# Patient Record
Sex: Male | Born: 1995 | Race: White | Hispanic: No | Marital: Married | Smoking: Current every day smoker
Health system: Southern US, Community
[De-identification: ages and names within clinical notes are randomized; demographics above are authoritative.]

## PROBLEM LIST (undated history)

## (undated) DIAGNOSIS — E669 Obesity, unspecified: Secondary | ICD-10-CM

## (undated) DIAGNOSIS — T7840XA Allergy, unspecified, initial encounter: Secondary | ICD-10-CM

## (undated) HISTORY — DX: Allergy, unspecified, initial encounter: T78.40XA

## (undated) HISTORY — DX: Obesity, unspecified: E66.9

---
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
## 2016-05-02 ENCOUNTER — Ambulatory Visit (HOSPITAL_COMMUNITY)
Admission: EM | Admit: 2016-05-02 | Discharge: 2016-05-02 | Disposition: A | Discharge status: Home / Self Care | Payer: Medicaid Other | Attending: Family Medicine | Admitting: Family Medicine

## 2016-05-02 ENCOUNTER — Encounter (HOSPITAL_COMMUNITY): Payer: Self-pay | Admitting: Emergency Medicine

## 2016-05-02 DIAGNOSIS — J039 Acute tonsillitis, unspecified: Secondary | ICD-10-CM

## 2016-05-02 MED ORDER — AMOXICILLIN 875 MG PO TABS: 875.0000 mg | ORAL_TABLET | Freq: Two times a day (BID) | ORAL | 0 refills | Status: DC

## 2016-05-02 MED ORDER — OSELTAMIVIR PHOSPHATE 75 MG PO CAPS: 75.0000 mg | ORAL_CAPSULE | Freq: Every day | ORAL | 0 refills | Status: DC

## 2016-05-02 NOTE — ED Triage Notes (Signed)
Pt c/o cold sx onset: yest  Sx include: nasal drainage/congestion, ST, sneezing, chest pain  Denies: fevers  A&O x4... NAD

## 2016-05-02 NOTE — ED Provider Notes (Signed)
[place]    CSN: [id number] Arrival date & time: 05/02/16  [place]     History   Chief Complaint Chief Complaint  Patient presents with  . URI    HPI Antonio Ellis is a 21 y.o. male.   This a 21 year old lad who comes in to the [facility] urgent care center with complaints consistent with an upper respiratory infection. His symptoms began yesterday with sore throat. He has not had any fever, runny nose, no myalgia.  Patient works in a Adult nurse. 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      Past Medical History:  Diagnosis Date  . Allergy    RHINITIS  . Obesity     Patient Active Problem List   Diagnosis Date Noted  . Need for hepatitis A vaccination 04/19/2011  . Need for HPV vaccination 04/19/2011  . Obesity 10/11/2010    Past Surgical History:  Procedure Laterality Date  . NO PAST SURGERIES  09/2014       Home Medications    Prior to Admission medications   Medication Sig Start Date End Date Taking? Authorizing Provider  amoxicillin (AMOXIL) 875 MG tablet Take 1 tablet (875 mg total) by mouth 2 (two) times daily. 05/02/16   Robyn Haber, MD  aspirin-acetaminophen-caffeine (EXCEDRIN MIGRAINE) [phone] MG per tablet Take 2 tablets by mouth every 6 (six) hours as needed for headache.    Historical Provider, MD  HYDROcodone-acetaminophen (NORCO/VICODIN) 5-325 MG tablet Take 2 tablets by mouth every 4 (four) hours as needed. 01/31/15   Fransico Meadow, PA-C  ibuprofen (ADVIL,MOTRIN) 200 MG tablet Take 200 mg by mouth every 6 (six) hours as needed.    Historical Provider, MD  oseltamivir (TAMIFLU) 75 MG capsule Take 1 capsule (75 mg total) by mouth daily. 05/02/16   Robyn Haber, MD    Family History Family History  Problem Relation Age of Onset  . Hypertension Father   . Heart disease Father 37    MI  . Hypertension Paternal Uncle   . Seizures Paternal Grandmother   . Hypertension Paternal Grandmother   . Heart disease Paternal  Grandmother   . Seizures Paternal Grandfather   . Hypertension Maternal Grandmother   . Hyperlipidemia Maternal Grandmother   . Diabetes Maternal Grandmother   . Cancer Maternal Grandfather     bladder  . Hypertension Maternal Grandfather   . Hyperlipidemia Maternal Grandfather     Social History Social History  Substance Use Topics  . Smoking status: Never Smoker  . Smokeless tobacco: Never Used  . Alcohol use No     Allergies   Patient has no known allergies.   Review of Systems Review of Systems  Constitutional: Negative.   HENT: Positive for sore throat.   Respiratory: Positive for cough.   Gastrointestinal: Negative.   Genitourinary: Negative.   Musculoskeletal: Negative.   Neurological: Negative.      Physical Exam Triage Vital Signs ED Triage Vitals [05/02/16 1901]  Enc Vitals Group     BP 118/72     Pulse Rate 64     Resp 20     Temp 98.2 F (36.8 C)     Temp Source Oral     SpO2 100 %     Weight      Height      Head Circumference      Peak Flow      Pain Score      Pain Loc  Pain Edu?      Excl. in [place]?    No data found.   Updated Vital Signs BP 118/72 (BP Location: Left Arm)   Pulse 64   Temp 98.2 F (36.8 C) (Oral)   Resp 20   SpO2 100%    Physical Exam  Constitutional: He is oriented to person, place, and time. He appears well-developed and well-nourished.  HENT:  Head: Normocephalic.  Right Ear: External ear normal.  Left Ear: External ear normal.  Mouth/Throat: Oropharyngeal exudate present.  Eyes: Conjunctivae and EOM are normal. Pupils are equal, round, and reactive to light.  Neck: Normal range of motion. No thyromegaly present.  Cardiovascular: Normal rate, regular rhythm and normal heart sounds.   Pulmonary/Chest: Effort normal and breath sounds normal.  Musculoskeletal: Normal range of motion.  Lymphadenopathy:    He has no cervical adenopathy.  Neurological: He is alert and oriented to person, place, and time.    Skin: Skin is warm and dry.  Nursing note and vitals reviewed.    UC Treatments / Results  Labs (all labs ordered are listed, but only abnormal results are displayed) Labs Reviewed - No data to display  EKG  EKG Interpretation None       Radiology No results found.  Procedures Procedures (including critical care time)  Medications Ordered in UC Medications - No data to display   Initial Impression / Assessment and Plan / UC Course  I have reviewed the triage vital signs and the nursing notes.  Pertinent labs & imaging results that were available during my care of the patient were reviewed by me and considered in my medical decision making (see chart for details).     Final Clinical Impressions(s) / UC Diagnoses   Final diagnoses:  Tonsillitis    New Prescriptions New Prescriptions   AMOXICILLIN (AMOXIL) 875 MG TABLET    Take 1 tablet (875 mg total) by mouth 2 (two) times daily.   OSELTAMIVIR (TAMIFLU) 75 MG CAPSULE    Take 1 capsule (75 mg total) by mouth daily.     Robyn Haber, MD 05/02/16 [phone]

## 2016-12-05 ENCOUNTER — Encounter: Payer: Self-pay | Admitting: Medical

## 2016-12-05 ENCOUNTER — Ambulatory Visit (INDEPENDENT_AMBULATORY_CARE_PROVIDER_SITE_OTHER): Payer: BLUE CROSS/BLUE SHIELD | Admitting: Medical

## 2016-12-05 ENCOUNTER — Ambulatory Visit: Payer: BLUE CROSS/BLUE SHIELD | Admitting: Medical

## 2016-12-05 VITALS — BP 124/80 | HR 66 | Wt 283.4 lb

## 2016-12-05 DIAGNOSIS — S99922A Unspecified injury of left foot, initial encounter: Secondary | ICD-10-CM | POA: Diagnosis not present

## 2016-12-05 DIAGNOSIS — M79672 Pain in left foot: Secondary | ICD-10-CM

## 2016-12-05 NOTE — Progress Notes (Signed)
Subjective: Chief Complaint  Patient presents with  . Foot Pain    lt foot pain , injury it yesterday    Here with wife and his 20 yo son.  He was chasing his uncle's dog that got out of the house yesterday to get him back in the house.   He was running and turned, felt a pop in his left foot, had immediate pain.  Since then has pain with weight bearing, can't bear full weight.    Iced some last night.  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  No other aggravating or relieving factors. No other complaint.  Past Medical History:  Diagnosis Date  . Allergy    RHINITIS  . Obesity    Current Outpatient Prescriptions on File Prior to Visit  Medication Sig Dispense Refill  . ibuprofen (ADVIL,MOTRIN) 200 MG tablet Take 200 mg by mouth every 6 (six) hours as needed.    Marland Kitchen aspirin-acetaminophen-caffeine (EXCEDRIN MIGRAINE) [phone] MG per tablet Take 2 tablets by mouth every 6 (six) hours as needed for headache.     No current facility-administered medications on file prior to visit.    ROS as in subjective   Objective: BP 124/80   Pulse 66   Wt 283 lb 6.4 oz (128.5 kg)   SpO2 98%    Gen: wd, wn, nad Skin: no bruising, warm Left foot tender over 5th proximal toe, left 5th metatarsal throughout, worse distal portion of the metatarsal.   Pain with weight bearing, pain with 4th and 5th toe ROM.   Mild swelling in general area of 5th distal metatarsal.  Ankle and rest of feet non tender without deformity, normal ROM of ankle and rest of toes, Toes and feet otherwise neurovascularly intact   Assessment: Encounter Diagnoses  Name Primary?  Marland Kitchen Foot injury, left, initial encounter Yes  . Left foot pain      Plan: Discussed possibility of fracture vs contusion and sprain.   Advised rest, non weight bearing left foot, elevation of foot, ice 20 min TID with cool water bath, Ibuprofen OTC 200mg , 3 tablets TID, go for xray.   F/u pending xray.  Nova was seen today for foot pain.  Diagnoses and  all orders for this visit:  Foot injury, left, initial encounter -     DG Foot Complete Left; Future  Left foot pain -     DG Foot Complete Left; Future

## 2017-02-11 ENCOUNTER — Encounter: Payer: Self-pay | Admitting: Family Medicine

## 2017-02-11 ENCOUNTER — Ambulatory Visit: Payer: BLUE CROSS/BLUE SHIELD | Admitting: Family Medicine

## 2017-02-11 VITALS — BP 120/80 | HR 92 | Temp 98.4°F | Resp 16 | Wt 238.6 lb

## 2017-02-11 DIAGNOSIS — J029 Acute pharyngitis, unspecified: Secondary | ICD-10-CM

## 2017-02-11 LAB — POC INFLUENZA A&B (BINAX/QUICKVUE)
Influenza A, POC: NEGATIVE
Influenza B, POC: NEGATIVE

## 2017-02-11 LAB — POCT RAPID STREP A (OFFICE): Rapid Strep A Screen: NEGATIVE

## 2017-02-11 NOTE — Progress Notes (Signed)
Chief Complaint  Patient presents with  . possible strep    possible strep. sore throat since yesterday    Subjective:  Antonio Ellis is a 21 y.o. male who presents for evaluation of sore throat.  He has had a recent close exposure to someone with proven streptococcal pharyngitis.  Associated symptoms include mild cough yesterday but none today.  States he has a 20 year old at home and his wife insists that he be tested for strep and flu.  Denies fever, chills, body aches, fatigue, headache, ear pain, rhinorrhea, nasal congestion, chest pain, palpitations, shortness of breath, wheezing, abdominal pain, N/V/D.   Treatment to date: cough suppressants and decongestants.  positive sick contacts.  No other aggravating or relieving factors.  No other c/o.  The following portions of the patient's history were reviewed and updated as appropriate: allergies, current medications, past medical history, past social history, past surgical history and problem list.  ROS as in subjective   Objective: Vitals:   02/11/17 0943  BP: 120/80  Pulse: 92  Resp: 16  Temp: 98.4 F (36.9 C)  SpO2: 98%    General appearance: no distress, WD/WN, is not ill-appearing HEENT: normocephalic, conjunctiva/corneas normal, sclerae anicteric, nares patent, no discharge or erythema, pharynx with erythema, no edema or exudate.  Oral cavity: MMM, no lesions  Neck: supple, no lymphadenopathy, no thyromegaly Heart: RRR, normal S1, S2, no murmurs Lungs: CTA bilaterally, no wheezes, rhonchi, or rales   Laboratory Strep test done. Results:negative.    Assessment and Plan: Acute pharyngitis, unspecified etiology - Plan: POC Influenza A&B(BINAX/QUICKVUE)  Sore throat - Plan: POCT rapid strep A   Advised that symptoms and exam suggest a viral etiology. Rapid strep is negative. He would like to be tested for flu. discussed that his symptoms are not flu-like and he understand this but would still like to be tested.   Flu test done and negative.   Discussed symptomatic treatment including salt water gargles, warm fluids, rest, hydrate well, can use over-the-counter Tylenol or ibuprofen for throat pain, fever, or malaise. If worse or not improving within 2-3 days, call or return.

## 2017-02-11 NOTE — Patient Instructions (Signed)
Your strep test is negative.   I suspect your symptoms are related to a viral illness and recommend treating your symptoms at this point.  Mucinex for congestion and cough, drink extra water, use salt water gargles for throat irritation and Tylenol or Ibuprofen for aches and pains.  Call if you are not improving by days 7-10 of your illness or if you develop any worsening symptoms such as high fever, shortness of breath or wheezing.

## 2019-03-03 ENCOUNTER — Other Ambulatory Visit: Payer: Self-pay

## 2019-03-03 DIAGNOSIS — Z20822 Contact with and (suspected) exposure to covid-19: Secondary | ICD-10-CM

## 2019-03-05 LAB — NOVEL CORONAVIRUS, NAA: SARS-CoV-2, NAA: NOT DETECTED

## 2019-03-12 ENCOUNTER — Other Ambulatory Visit: Payer: Self-pay

## 2019-03-12 ENCOUNTER — Ambulatory Visit: Payer: BC Managed Care – PPO | Admitting: Medical

## 2019-03-12 VITALS — BP 120/70 | HR 73 | Temp 97.8°F | Wt 272.0 lb

## 2019-03-12 DIAGNOSIS — L089 Local infection of the skin and subcutaneous tissue, unspecified: Secondary | ICD-10-CM

## 2019-03-12 DIAGNOSIS — L723 Sebaceous cyst: Secondary | ICD-10-CM | POA: Diagnosis not present

## 2019-03-12 MED ORDER — CEPHALEXIN 500 MG PO CAPS: 500.0000 mg | ORAL_CAPSULE | Freq: Three times a day (TID) | ORAL | 0 refills | Status: DC

## 2019-03-12 MED FILL — CEPHALEXIN 500 MG CAPSULE: 500 | 10 days supply | Qty: 30 | Fill #0

## 2019-03-12 NOTE — Progress Notes (Signed)
  Subjective:   Antonio Ellis is a 23 y.o. male who presents for evaluation of a probable cutaneous abscess. Lesion is located in the center of the chest.  He notes having some type of subcutaneous mass there months ago that has come and gone before but never swollen or red.  No prior drainage.  2 days ago a piece of wood bumped into his chest and then within the next few hours he started having redness and then later that day some pus drainage.  That has gotten gradually worse till now.  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  He denies concerns for splinter or foreign body.  No prior problems with incision and drainage of other abscesses. No other aggravating or relieving factors.  No other c/o.  Past Medical History:  Diagnosis Date  . Allergy    RHINITIS  . Obesity     Reviewed prior allergies, medications, past medical history, past surgical history.  ROS as in subjective   Objective:   Vitals:   03/12/19 1203  BP: 120/70  Pulse: 73  Temp: 97.8 F (36.6 C)    Gen: wd, wn, nad In the center of his chest between the breasts over the sternum is a 2.5 cm diameter raised subcutaneous mass consistent with an infected sebaceous cyst.  There is erythema at the punctate entry point with some slight purulent drainage but no generalized erythema or induration No other skin lesion of concern   Assessment:   Encounter Diagnosis  Name Primary?  . Infected sebaceous cyst Yes      Plan:   Discussed examination findings, diagnosis, usual course of illness, and options for therapy discussed. After discussing recommendations, patient agrees to I&D, oral antibiotics.    Procedure Informed consent obtained.  The area was prepped in the usual manner and the skin overlying the opening of the infected cyst was anesthetized with 2cc of 1% lidocaine with epinephrine.  The area was incised and probed, approx 4ccs of purulent and bloody material was obtained.  Area was irrigated with  high pressure saline. Packing was not inserted. Wound was covered with sterile bandage.    Advised patient to complete the course of oral antibiotics, use warm compresses or heat applied to the area to promote drainage.  Follow up: As needed.  However, if worse signs of infections as discussed (fever, chills, nausea, vomiting, worsening redness, worsening pain), then call or return immediately.

## 2019-03-12 NOTE — Patient Instructions (Signed)
Recommendations:  Begin Keflex antibiotic 3 times daily for 10 days  Use warm compresses such as moist hot towel for 20 minutes a few times daily or hot shower for cleaning and heat therapy to the affected area the next 3-4 days  Keep the area covered with bandaid or gauze as it will likely drain some more pus the next few days  You can use over the counter Ibuprofen 200mg , 3 tablets 2 or 3 times daily the next week for redness, inflammation and pain if needed  If not gradually improving within 10 days, then call or return  If the area seems to fill back up like a cyst again over the next few weeks let me know and we can refer dermatology   Epidermal Cyst  An epidermal cyst is a small, painless lump under your skin. The cyst contains a grayish-white, bad-smelling substance (keratin). Do not try to pop or open an epidermal cyst yourself. What are the causes?  A blocked hair follicle.  A hair that curls and re-enters the skin instead of growing straight out of the skin.  A blocked pore.  Irritated skin.  An injury to the skin.  Certain conditions that are passed along from parent to child (inherited).  Human papillomavirus (HPV).  Long-term sun damage to the skin. What increases the risk?  Having acne.  Being overweight.  Being 37-58 years old. What are the signs or symptoms? These cysts are usually harmless, but they can get infected. Symptoms of infection may include:  Redness.  Inflammation.  Tenderness.  Warmth.  Fever.  A grayish-white, bad-smelling substance drains from the cyst.  Pus drains from the cyst. How is this treated? In many cases, epidermal cysts go away on their own without treatment. If a cyst becomes infected, treatment may include:  Opening and draining the cyst, done by a doctor. After draining, you may need minor surgery to remove the rest of the cyst.  Antibiotic medicine.  Shots of medicines (steroids) that help to reduce  inflammation.  Surgery to remove the cyst. Surgery may be done if the cyst: ? Becomes large. ? Bothers you. ? Has a chance of turning into cancer.  Do not try to open a cyst yourself. Follow these instructions at home:  Take over-the-counter and prescription medicines only as told by your doctor.  If you were prescribed an antibiotic medicine, take it it as told by your doctor. Do not stop using the antibiotic even if you start to feel better.  Keep the area around your cyst clean and dry.  Wear loose, dry clothing.  Avoid touching your cyst.  Check your cyst every day for signs of infection. Check for: ? Redness, swelling, or pain. ? Fluid or blood. ? Warmth. ? Pus or a bad smell.  Keep all follow-up visits as told by your doctor. This is important. How is this prevented?  Wear clean, dry, clothing.  Avoid wearing tight clothing.  Keep your skin clean and dry. Take showers or baths every day. Contact a doctor if:  Your cyst has symptoms of infection.  Your condition does not improve or gets worse.  You have a cyst that looks different from other cysts you have had.  You have a fever. Get help right away if:  Redness spreads from the cyst into the area close by. Summary  An epidermal cyst is a sac made of skin tissue.  If a cyst becomes infected, treatment may include surgery to open and drain  the cyst, or to remove it.  Take over-the-counter and prescription medicines only as told by your doctor.  Contact a doctor if your condition is not improving or is getting worse.  Keep all follow-up visits as told by your doctor. This is important. This information is not intended to replace advice given to you by your health care provider. Make sure you discuss any questions you have with your health care provider. Document Released: 04/25/2004 Document Revised: 07/09/2018 Document Reviewed: 12/25/2017 Elsevier Patient Education  2020 [employer].

## 2019-04-12 ENCOUNTER — Other Ambulatory Visit: Payer: Self-pay

## 2019-04-12 ENCOUNTER — Emergency Department
Admission: EM | Admit: 2019-04-12 | Discharge: 2019-04-12 | Disposition: A | Discharge status: Home / Self Care | Payer: Self-pay | Source: Home / Self Care

## 2019-04-12 DIAGNOSIS — Z20822 Contact with and (suspected) exposure to covid-19: Secondary | ICD-10-CM

## 2019-04-12 DIAGNOSIS — B349 Viral infection, unspecified: Secondary | ICD-10-CM | POA: Diagnosis not present

## 2019-04-12 LAB — POC INFLUENZA A AND B ANTIGEN (URGENT CARE ONLY)
Influenza A Ag: NEGATIVE
Influenza B Ag: NEGATIVE

## 2019-04-12 LAB — POC SARS CORONAVIRUS 2 AG -  ED: SARS Coronavirus 2 Ag: NEGATIVE

## 2019-04-12 NOTE — ED Triage Notes (Signed)
Pt presents to clinic c/o body aches, fever and headaches. To the best of his knowledge no known exposure. Pt has taken Nyquil for tmax 102 F. No other complaints.  Pt has been self quarantined since onset of symptoms 04/10/2019.

## 2019-04-12 NOTE — ED Provider Notes (Signed)
[facility]    CSN: [id number] Arrival date & time: 04/12/19  0956      History   Chief Complaint Chief Complaint  Patient presents with  . Fever  . Generalized Body Aches    HPI Antonio Ellis is a 24 y.o. male.   HPI  Antonio Ellis presents for COVID-19 testing.   Onset of symptoms 04/10/2019.Symptoms include body aches, chills, fever, headache, fever. TMAX 102 F, currently has low grade fever 99.17F. No known exposures to individual positive for COVID-19. No recent COVID-19 test within 14 days. He has taken Nyquil for management of symptoms without significant relief. Denies shortness of breath or chest tightness. Past Medical History:  Diagnosis Date  . Allergy    RHINITIS  . Obesity     Patient Active Problem List   Diagnosis Date Noted  . Need for hepatitis A vaccination 04/19/2011  . Need for HPV vaccination 04/19/2011  . Obesity 10/11/2010    Past Surgical History:  Procedure Laterality Date  . NO PAST SURGERIES  09/2014    Home Medications    Prior to Admission medications   Medication Sig Start Date End Date Taking? Authorizing Provider  cephALEXin (KEFLEX) 500 MG capsule Take 1 capsule (500 mg total) by mouth 3 (three) times daily. 03/12/19   Tysinger, Camelia Eng, PA-C    Family History Family History  Problem Relation Age of Onset  . Hypertension Father   . Heart disease Father 52       MI  . Hypertension Paternal Uncle   . Seizures Paternal Grandmother   . Hypertension Paternal Grandmother   . Heart disease Paternal Grandmother   . Seizures Paternal Grandfather   . Hypertension Maternal Grandmother   . Hyperlipidemia Maternal Grandmother   . Diabetes Maternal Grandmother   . Cancer Maternal Grandfather        bladder  . Hypertension Maternal Grandfather   . Hyperlipidemia Maternal Grandfather     Social History Social History   Tobacco Use  . Smoking status: Never Smoker  . Smokeless tobacco: Never Used  Substance Use  Topics  . Alcohol use: No  . Drug use: No     Allergies   Patient has no known allergies.   Review of Systems Review of Systems Pertinent negatives listed in HPI Physical Exam Triage Vital Signs ED Triage Vitals  Enc Vitals Group     BP 04/12/19 1038 117/78     Pulse Rate 04/12/19 1038 93     Resp 04/12/19 1038 19     Temp 04/12/19 1038 99.1 F (37.3 C)     Temp Source 04/12/19 1038 Oral     SpO2 04/12/19 1038 97 %     Weight 04/12/19 1035 264 lb (119.7 kg)     Height 04/12/19 1035 5\' 9"  (1.753 m)     Head Circumference --      Peak Flow --      Pain Score 04/12/19 1035 0     Pain Loc --      Pain Edu? --      Excl. in [place]? --    No data found.  Updated Vital Signs BP 117/78 (BP Location: Left Arm)   Pulse 93   Temp 99.1 F (37.3 C) (Oral)   Resp 19   Ht 5\' 9"  (1.753 m)   Wt 264 lb (119.7 kg)   SpO2 97%   BMI 38.99 kg/m   Visual Acuity Right Eye Distance:   Left  Eye Distance:   Bilateral Distance:    Right Eye Near:   Left Eye Near:    Bilateral Near:     Physical Exam General appearance: alert, ill-appearing, cooperative and without distress. Head: Normocephalic, without obvious abnormality, atraumatic Respiratory: Respirations even and unlabored, normal respiratory rate Heart: rate and rhythm normal. No gallop or murmurs noted on exam  Abdomen: BS +, no distention, no rebound tenderness, or no mass Extremities: No gross deformities Skin: Skin color, texture, turgor normal. No rashes seen  Psych: Appropriate mood and affect. Neurologic: Mental status: Alert, oriented to person, place, and time, thought content appropriate.  UC Treatments / Results  Labs (all labs ordered are listed, but only abnormal results are displayed) Labs Reviewed - No data to display  EKG   Radiology No results found.  Procedures Procedures (including critical care time)  Medications Ordered in UC Medications - No data to display  Initial Impression /  Assessment and Plan / UC Course  I have reviewed the triage vital signs and the nursing notes.  Pertinent labs & imaging results that were available during my care of the patient were reviewed by me and considered in my medical decision making (see chart for details).      Final Clinical Impressions(s) / UC Diagnoses   Final diagnoses:  Viral illness  Encounter for laboratory testing for COVID-19 virus     Discharge Instructions     Recommend alternating tylenol and ibuprofen for management of body aches and fever. Rapid covid-19 invalid, a send out COVID test is pending. Your COVID 19 results will be available in 48-72 hours. Negative results are immediately resulted to Mychart. All positive results are communicated with a phone call from our office. Your influenza test was negative. I recommend hydrating well with fluids and return if any other symptoms develop. No work while febrile as you are considered contagious.    ED Prescriptions    None     PDMP not reviewed this encounter.   Scot Jun, FNP 04/14/19 [phone]

## 2019-04-12 NOTE — Discharge Instructions (Addendum)
Recommend alternating tylenol and ibuprofen for management of body aches and fever. Rapid covid-19 invalid, a send out COVID test is pending. Your COVID 19 results will be available in 48-72 hours. Negative results are immediately resulted to Mychart. All positive results are communicated with a phone call from our office. Your influenza test was negative. I recommend hydrating well with fluids and return if any other symptoms develop. No work while febrile as you are considered contagious.

## 2019-04-14 ENCOUNTER — Telehealth (HOSPITAL_COMMUNITY): Payer: Self-pay | Admitting: Emergency Medicine

## 2019-04-14 LAB — NOVEL CORONAVIRUS, NAA: SARS-CoV-2, NAA: DETECTED — AB

## 2019-04-14 NOTE — Telephone Encounter (Signed)

## 2019-05-05 ENCOUNTER — Ambulatory Visit: Payer: BC Managed Care – PPO | Admitting: Medical

## 2019-07-06 DIAGNOSIS — F5221 Male erectile disorder: Secondary | ICD-10-CM | POA: Diagnosis not present

## 2019-07-06 MED FILL — SILDENAFIL 20 MG TABLET: 20 | 10 days supply | Qty: 20 | Fill #0

## 2019-07-26 ENCOUNTER — Encounter: Payer: BC Managed Care – PPO | Admitting: Medical

## 2019-07-31 HISTORY — PX: NO PAST SURGERIES: SHX2092

## 2019-08-03 ENCOUNTER — Encounter: Payer: Self-pay | Admitting: Medical

## 2019-08-03 ENCOUNTER — Ambulatory Visit: Payer: BC Managed Care – PPO | Admitting: Medical

## 2019-08-03 VITALS — BP 110/72 | HR 67 | Temp 98.1°F | Ht 68.0 in | Wt 268.8 lb

## 2019-08-03 DIAGNOSIS — E669 Obesity, unspecified: Secondary | ICD-10-CM | POA: Diagnosis not present

## 2019-08-03 DIAGNOSIS — L709 Acne, unspecified: Secondary | ICD-10-CM

## 2019-08-03 DIAGNOSIS — Z630 Problems in relationship with spouse or partner: Secondary | ICD-10-CM | POA: Insufficient documentation

## 2019-08-03 DIAGNOSIS — Z131 Encounter for screening for diabetes mellitus: Secondary | ICD-10-CM | POA: Diagnosis not present

## 2019-08-03 DIAGNOSIS — Z0000 Encounter for general adult medical examination without abnormal findings: Secondary | ICD-10-CM | POA: Diagnosis not present

## 2019-08-03 DIAGNOSIS — N529 Male erectile dysfunction, unspecified: Secondary | ICD-10-CM | POA: Insufficient documentation

## 2019-08-03 DIAGNOSIS — Z7189 Other specified counseling: Secondary | ICD-10-CM

## 2019-08-03 NOTE — Progress Notes (Addendum)
Subjective:   HPI  Antonio Ellis is a 24 y.o. male who presents for Chief Complaint  Patient presents with  . Annual Exam    fasting     Patient Care Team: Angella Montas, Camelia Eng, PA-C as PCP - General (Family Medicine) Sees dentist Sees eye doctor  Concerns: Had covid earlier this year, had several days of chills, body aches, but no severe infection.   Had a lot of anxiety around getting it though.   Lost smell but that has resolved.  He notes marital problems.  Been married 5 years this Friday, has 44yo child.  He and wife are starting marital counseling this week  Has been having problems getting and keeping erections for months.  Gets some erections thought, some morning erections.    Tried to go see urology.  Had appt, per his recollection, was told he was too young for this and it was all in his head.  He felt disappointed and defeated after the appt.   Was given Viagra which helped, but overall had bad experience.  Reviewed their medical, surgical, family, social, medication, and allergy history and updated chart as appropriate.  Past Medical History:  Diagnosis Date  . Allergy    RHINITIS  . Obesity     Past Surgical History:  Procedure Laterality Date  . NO PAST SURGERIES  07/2019    Social History   Socioeconomic History  . Marital status: Married    Spouse name: Not on file  . Number of children: Not on file  . Years of education: Not on file  . Highest education level: Not on file  Occupational History  . Occupation: Ship broker  Tobacco Use  . Smoking status: Never Smoker  . Smokeless tobacco: Never Used  Substance and Sexual Activity  . Alcohol use: Yes    Alcohol/week: 2.0 standard drinks    Types: 2 Cans of beer per week  . Drug use: Yes    Types: Marijuana  . Sexual activity: Never  Other Topics Concern  . Not on file  Social History Narrative   Married, 4yo child.   older brother. Has older half-sister who is grown and out of the house. Dropped  out of high school junior year.   Was at [employer], then went to [employer]. 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   07/2019   Social Determinants of Health   Financial Resource Strain:   . Difficulty of Paying Living Expenses:   Food Insecurity:   . Worried About Charity fundraiser in the Last Year:   . Arboriculturist in the Last Year:   Transportation Needs:   . Film/video editor (Medical):   Marland Kitchen Lack of Transportation (Non-Medical):   Physical Activity:   . Days of Exercise per Week:   . Minutes of Exercise per Session:   Stress:   . Feeling of Stress :   Social Connections:   . Frequency of Communication with Friends and Family:   . Frequency of Social Gatherings with Friends and Family:   . Attends Religious Services:   . Active Member of Clubs or Organizations:   . Attends Archivist Meetings:   Marland Kitchen Marital Status:   Intimate Partner Violence:   . Fear of Current or Ex-Partner:   . Emotionally Abused:   Marland Kitchen Physically Abused:   . Sexually Abused:     Family History  Problem Relation Age of Onset  . Hypertension Father   . Heart  disease Father 38       MI  . Hypertension Paternal Uncle   . Seizures Paternal Grandmother   . Hypertension Paternal Grandmother   . Heart disease Paternal Grandmother   . Seizures Paternal Grandfather   . Hypertension Maternal Grandmother   . Hyperlipidemia Maternal Grandmother   . Diabetes Maternal Grandmother   . Cancer Maternal Grandfather        bladder  . Hypertension Maternal Grandfather   . Hyperlipidemia Maternal Grandfather   . Stroke Neg Hx      Current Outpatient Medications:  .  cephALEXin (KEFLEX) 500 MG capsule, Take 1 capsule (500 mg total) by mouth 3 (three) times daily. (Patient not taking: Reported on 08/03/2019), Disp: 30 capsule, Rfl: 0  No Known Allergies     Review of Systems Constitutional: -fever, -chills, -sweats, -unexpected weight change, -decreased appetite, -fatigue Allergy: -sneezing,  -itching, -congestion Dermatology: -changing moles, --rash, -lumps ENT: -runny nose, -ear pain, -sore throat, -hoarseness, -sinus pain, -teeth pain, - ringing in ears, -hearing loss, -nosebleeds Cardiology: -chest pain, -palpitations, -swelling, -difficulty breathing when lying flat, -waking up short of breath Respiratory: -cough, -shortness of breath, -difficulty breathing with exercise or exertion, -wheezing, -coughing up blood Gastroenterology: -abdominal pain, -nausea, -vomiting, -diarrhea, -constipation, -blood in stool, -changes in bowel movement, -difficulty swallowing or eating Hematology: -bleeding, -bruising  Musculoskeletal: -joint aches, -muscle aches, -joint swelling, -back pain, -neck pain, -cramping, -changes in gait Ophthalmology: denies vision changes, eye redness, itching, discharge Urology: -burning with urination, -difficulty urinating, -blood in urine, -urinary frequency, -urgency, -incontinence Neurology: -headache, -weakness, -tingling, -numbness, -memory loss, -falls, -dizziness Psychology: +depressed mood, -agitation, -sleep problems Male GU: no testicular mass, pain, no lymph nodes swollen, no swelling, no rash.     Objective:  BP 110/72   Pulse 67   Temp 98.1 F (36.7 C)   Ht 5\' 8"  (1.727 m)   Wt 268 lb 12.8 oz (121.9 kg)   SpO2 98%   BMI 40.87 kg/m   General appearance: alert, no distress, WD/WN, Caucasian male Skin: scattered moderate acne back and chest, no other worrisome lesions Neck: supple, no lymphadenopathy, no thyromegaly, no masses, normal ROM, no bruits Chest: non tender, normal shape and expansion Heart: RRR, normal S1, S2, no murmurs Lungs: CTA bilaterally, no wheezes, rhonchi, or rales Abdomen: +bs, soft, non tender, non distended, no masses, no hepatomegaly, no splenomegaly, no bruits Back: non tender, normal ROM, no scoliosis Musculoskeletal: upper extremities non tender, no obvious deformity, normal ROM throughout, lower extremities non  tender, no obvious deformity, normal ROM throughout Extremities: no edema, no cyanosis, no clubbing Pulses: 2+ symmetric, upper and lower extremities, normal cap refill Neurological: alert, oriented x 3, CN2-12 intact, strength normal upper extremities and lower extremities, sensation normal throughout, DTRs 2+ throughout, no cerebellar signs, gait normal Psychiatric: seems down, otherwise behavior normal, pleasant  GU: normal male external genitalia,circumcised, small brown 41mm x 82mm flat macule on dorsal glans of penis, unchanged per patient for over a year, nontender, +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  . Encounter for health maintenance examination in adult Yes  . Screening for diabetes mellitus   . Obesity, unspecified classification, unspecified obesity type, unspecified whether serious comorbidity present   . Erectile dysfunction, unspecified erectile dysfunction type   . Marital conflict   . Counseled about COVID-19 virus infection   . Acne, unspecified acne type  Physical exam - discussed and counseled on healthy lifestyle, diet, exercise, preventative care, vaccinations, sick and well care, proper use of emergency dept and after hours care, and addressed their concerns.    Health screening: See your eye doctor yearly for routine vision care. See your dentist yearly for routine dental care including hygiene visits twice yearly.  Cancer screening Advised monthly self testicular exam   Vaccinations: Advised yearly influenza vaccine Due for Td vaccine.  He will consider.   Separate significant  issues discussed: We discussed his concerns, marital conflict, ED issues.  We discussed possible causes of ADD.  I voiced understanding of the frustration he had with the prior appointment with urology.  I encouraged him to continue with plan to see counselor starting this  week for marital counseling.  He did try Viagra with some response.  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  He did have Covid infection a few months ago.  We discussed his illness, and discussed his concerns about any residual reaction that is not clear at this point.  I have no reason to suspect his ED issues are related to Covid infection.  Acne - consider topical vs oral treatment  follow-up pending labs  Tayvien was seen today for annual exam.  Diagnoses and all orders for this visit:  Encounter for health maintenance examination in adult -     Comprehensive metabolic panel -     Testosterone -     Lipid panel -     Hemoglobin A1c -     Lipid panel -     CBC  Screening for diabetes mellitus -     Hemoglobin A1c  Obesity, unspecified classification, unspecified obesity type, unspecified whether serious comorbidity present  Erectile dysfunction, unspecified erectile dysfunction type -     Testosterone  Marital conflict  Counseled about COVID-19 virus infection  Acne, unspecified acne type    Follow-up pending labs, yearly for physical

## 2019-08-04 LAB — COMPREHENSIVE METABOLIC PANEL
ALT: 23 IU/L (ref 0–44)
AST: 23 IU/L (ref 0–40)
Albumin/Globulin Ratio: 2 (ref 1.2–2.2)
Albumin: 4.7 g/dL (ref 4.1–5.2)
Alkaline Phosphatase: 65 IU/L (ref 39–117)
BUN/Creatinine Ratio: 7 — ABNORMAL LOW (ref 9–20)
BUN: 6 mg/dL (ref 6–20)
Bilirubin Total: 0.7 mg/dL (ref 0.0–1.2)
CO2: 25 mmol/L (ref 20–29)
Calcium: 9.8 mg/dL (ref 8.7–10.2)
Chloride: 100 mmol/L (ref 96–106)
Creatinine, Ser: 0.84 mg/dL (ref 0.76–1.27)
GFR calc Af Amer: 144 mL/min/{1.73_m2} (ref >59)
GFR calc non Af Amer: 124 mL/min/{1.73_m2} (ref >59)
Globulin, Total: 2.4 g/dL (ref 1.5–4.5)
Glucose: 107 mg/dL — ABNORMAL HIGH (ref 65–99)
Potassium: 4.1 mmol/L (ref 3.5–5.2)
Sodium: 138 mmol/L (ref 134–144)
Total Protein: 7.1 g/dL (ref 6.0–8.5)

## 2019-08-04 LAB — LIPID PANEL
Chol/HDL Ratio: 3.9 ratio (ref 0.0–5.0)
Cholesterol, Total: 165 mg/dL (ref 100–199)
HDL: 42 mg/dL (ref >39)
LDL Chol Calc (NIH): 110 mg/dL — ABNORMAL HIGH (ref 0–99)
Triglycerides: 68 mg/dL (ref 0–149)
VLDL Cholesterol Cal: 13 mg/dL (ref 5–40)

## 2019-08-04 LAB — CBC
Hematocrit: 44.7 % (ref 37.5–51.0)
Hemoglobin: 15.3 g/dL (ref 13.0–17.7)
MCH: 30.3 pg (ref 26.6–33.0)
MCHC: 34.2 g/dL (ref 31.5–35.7)
MCV: 89 fL (ref 79–97)
Platelets: 253 10*3/uL (ref 150–450)
RBC: 5.05 x10E6/uL (ref 4.14–5.80)
RDW: 12.6 % (ref 11.6–15.4)
WBC: 5.7 10*3/uL (ref 3.4–10.8)

## 2019-08-04 LAB — TESTOSTERONE: Testosterone: 417 ng/dL (ref 264–916)

## 2019-08-04 LAB — HEMOGLOBIN A1C
Est. average glucose Bld gHb Est-mCnc: 100 mg/dL
Hgb A1c MFr Bld: 5.1 % (ref 4.8–5.6)

## 2019-08-04 NOTE — Progress Notes (Signed)
Please schedule him a 1 month follow-up  I sent the other lab results to my chart  Your labs are basically normal.  You were not fasting since you had drank some soda yesterday which would probably explain the sugar being slightly elevated.  Otherwise all your labs are normal including testosterone, liver, kidney electrolytes and blood count.  I recommend you work on marital counseling as you mention you were starting this week.  I think this would be a great idea.  Typically this is a session every 1 to 2 weeks for the next several weeks till you feel like you are reaching some goals.     I recommend you read the 5 Love Languages book which can be quite helpful in your interactions with your wife.    You can use Viagra low-dose every now and then as needed, but at this point I would agree with urology that this is probably not a physical issue.    Let us plan to follow-up in 1 month to see how things are going

## 2019-08-31 ENCOUNTER — Telehealth (INDEPENDENT_AMBULATORY_CARE_PROVIDER_SITE_OTHER): Payer: BC Managed Care – PPO | Admitting: Medical

## 2019-08-31 ENCOUNTER — Encounter: Payer: Self-pay | Admitting: Medical

## 2019-08-31 ENCOUNTER — Other Ambulatory Visit: Payer: Self-pay

## 2019-08-31 VITALS — Temp 99.6°F | Ht 68.0 in | Wt 268.0 lb

## 2019-08-31 DIAGNOSIS — Z20818 Contact with and (suspected) exposure to other bacterial communicable diseases: Secondary | ICD-10-CM | POA: Diagnosis not present

## 2019-08-31 DIAGNOSIS — J029 Acute pharyngitis, unspecified: Secondary | ICD-10-CM | POA: Diagnosis not present

## 2019-08-31 MED ORDER — AMOXICILLIN 500 MG PO TABS: 500.0000 mg | ORAL_TABLET | Freq: Three times a day (TID) | ORAL | 0 refills | Status: DC

## 2019-08-31 MED FILL — AMOXICILLIN 500 MG CAPSULE: 500 | 10 days supply | Qty: 30 | Fill #0

## 2019-08-31 NOTE — Progress Notes (Signed)
  Subjective:     Patient ID: Antonio Ellis, male   DOB: 01-10-1996, 24 y.o.   MRN: [id number]  This visit type was conducted due to national recommendations for restrictions regarding the COVID-19 Pandemic (e.g. social distancing) in an effort to limit this patient's exposure and mitigate transmission in our community.  Due to their co-morbid illnesses, this patient is at least at moderate risk for complications without adequate follow up.  This format is felt to be most appropriate for this patient at this time.    Documentation for virtual audio and video telecommunications through Zoom encounter:  The patient was located at home. The provider was located in the office. The patient did consent to this visit and is aware of possible charges through their insurance for this visit.  The other persons participating in this telemedicine service were none. Time spent on call was 20 minutes and in review of previous records 20 minutes total.  This virtual service is not related to other E/M service within previous 7 days.   HPI Chief Complaint  Patient presents with  . URI    sore throat, runny nose,congestion,cough    Virtual consult for sore throat and strep exposure.  His wife was diagnosed with strep throat last week and treated with antibiotic.  Over the weekend he developed sore throat, body aches, low-grade fever.  He had some congestion but they had been out 4 wheeling and a lot of dust aggravated his sinuses.  Otherwise he has been using DayQuil in supportive care with some improvement.  Of note he had Covid infection earlier part of this year.  No recent Covid exposure no other aggravating or relieving factors. No other complaint.   Review of Systems As in subjective    Objective:   Physical Exam Due to coronavirus pandemic stay at home measures, patient visit was virtual and they were not examined in person.   Temp 99.6 F (37.6 C)   Ht 5\' 8"  (1.727 m)   Wt 268 lb (121.6  kg)   BMI 40.75 kg/m       Assessment:     Encounter Diagnoses  Name Primary?  . Streptococcus exposure Yes  . Sore throat        Plan:     Given the exposure, begin amoxicillin, rest, hydrate well, can continue DayQuil for the time being for the next several days, can use salt water gargles, warm fluids, Chloraseptic sore throat spray as needed, ibuprofen over-the-counter the next few days.  If worse or not improving the next 3 to 4 days then call back or recheck  Antonio Ellis was seen today for uri.  Diagnoses and all orders for this visit:  Streptococcus exposure  Sore throat  Other orders -     amoxicillin (AMOXIL) 500 MG tablet; Take 1 tablet (500 mg total) by mouth in the morning, at noon, and at bedtime.

## 2019-09-01 ENCOUNTER — Telehealth: Payer: BC Managed Care – PPO | Admitting: Medical

## 2020-01-19 DIAGNOSIS — S93402A Sprain of unspecified ligament of left ankle, initial encounter: Secondary | ICD-10-CM | POA: Diagnosis not present

## 2020-01-19 DIAGNOSIS — S92152A Displaced avulsion fracture (chip fracture) of left talus, initial encounter for closed fracture: Secondary | ICD-10-CM | POA: Diagnosis not present

## 2020-01-19 DIAGNOSIS — S99912A Unspecified injury of left ankle, initial encounter: Secondary | ICD-10-CM | POA: Diagnosis not present

## 2020-01-19 DIAGNOSIS — W108XXA Fall (on) (from) other stairs and steps, initial encounter: Secondary | ICD-10-CM | POA: Diagnosis not present

## 2020-01-19 DIAGNOSIS — S92155A Nondisplaced avulsion fracture (chip fracture) of left talus, initial encounter for closed fracture: Secondary | ICD-10-CM | POA: Diagnosis not present

## 2020-01-21 DIAGNOSIS — M79641 Pain in right hand: Secondary | ICD-10-CM | POA: Diagnosis not present

## 2020-01-21 DIAGNOSIS — M25572 Pain in left ankle and joints of left foot: Secondary | ICD-10-CM | POA: Diagnosis not present

## 2020-02-04 DIAGNOSIS — M25572 Pain in left ankle and joints of left foot: Secondary | ICD-10-CM | POA: Diagnosis not present

## 2020-02-13 ENCOUNTER — Encounter (HOSPITAL_COMMUNITY): Payer: Self-pay | Admitting: Emergency Medicine

## 2020-02-13 ENCOUNTER — Other Ambulatory Visit: Payer: Self-pay

## 2020-02-13 ENCOUNTER — Emergency Department (HOSPITAL_COMMUNITY)
Admission: EM | Admit: 2020-02-13 | Discharge: 2020-02-13 | Disposition: A | Discharge status: Home / Self Care | Payer: BC Managed Care – PPO | Attending: Emergency Medicine | Admitting: Emergency Medicine

## 2020-02-13 DIAGNOSIS — J028 Acute pharyngitis due to other specified organisms: Secondary | ICD-10-CM | POA: Insufficient documentation

## 2020-02-13 DIAGNOSIS — B9789 Other viral agents as the cause of diseases classified elsewhere: Secondary | ICD-10-CM | POA: Diagnosis not present

## 2020-02-13 DIAGNOSIS — J029 Acute pharyngitis, unspecified: Secondary | ICD-10-CM

## 2020-02-13 DIAGNOSIS — R070 Pain in throat: Secondary | ICD-10-CM | POA: Diagnosis not present

## 2020-02-13 LAB — GROUP A STREP BY PCR: Group A Strep by PCR: NOT DETECTED

## 2020-02-13 MED ORDER — ALUM & MAG HYDROXIDE-SIMETH 200-200-20 MG/5ML PO SUSP
30.0000 mL | Freq: Once | ORAL | Status: AC
  Administered 2020-02-13: 30 mL via ORAL
  Filled 2020-02-13: qty 30

## 2020-02-13 MED ORDER — IBUPROFEN 800 MG PO TABS: 800.0000 mg | ORAL_TABLET | Freq: Three times a day (TID) | ORAL | 0 refills | Status: DC

## 2020-02-13 MED ORDER — LIDOCAINE VISCOUS HCL 2 % MT SOLN
15.0000 mL | Freq: Once | OROMUCOSAL | Status: AC
  Administered 2020-02-13: 15 mL via ORAL
  Filled 2020-02-13: qty 15

## 2020-02-13 MED ORDER — IBUPROFEN 800 MG PO TABS
800.0000 mg | ORAL_TABLET | Freq: Once | ORAL | Status: AC
  Administered 2020-02-13: 800 mg via ORAL
  Filled 2020-02-13: qty 1
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
## 2020-02-13 NOTE — Discharge Instructions (Signed)
Your strep test is negative right now.  Take Tylenol and ibuprofen for sore throat.  Stay hydrated.  You may try Cepacol which is over-the-counter for sore throat as well.  See your doctor for follow-up  Return to ER if you have worse sore throat, throat swelling, trouble swallowing, dehydration, fever

## 2020-02-13 NOTE — ED Triage Notes (Signed)
Pt reports sore throat starting Friday. Worse with swallowing. Denies cough. Endorses body aches. Unsure of fever. States that he has been taking Nyquil. A&Ox4.

## 2020-02-16 ENCOUNTER — Emergency Department (HOSPITAL_COMMUNITY)
Admission: EM | Admit: 2020-02-16 | Discharge: 2020-02-16 | Disposition: A | Discharge status: Home / Self Care | Payer: BC Managed Care – PPO

## 2020-02-16 ENCOUNTER — Other Ambulatory Visit: Payer: Self-pay

## 2020-02-16 DIAGNOSIS — J029 Acute pharyngitis, unspecified: Secondary | ICD-10-CM | POA: Diagnosis not present

## 2020-02-16 DIAGNOSIS — H6123 Impacted cerumen, bilateral: Secondary | ICD-10-CM | POA: Diagnosis not present

## 2020-02-16 DIAGNOSIS — H9201 Otalgia, right ear: Secondary | ICD-10-CM | POA: Diagnosis not present

## 2020-02-18 ENCOUNTER — Other Ambulatory Visit (INDEPENDENT_AMBULATORY_CARE_PROVIDER_SITE_OTHER): Payer: BC Managed Care – PPO

## 2020-02-18 ENCOUNTER — Ambulatory Visit: Payer: BC Managed Care – PPO | Admitting: Medical

## 2020-02-18 ENCOUNTER — Other Ambulatory Visit: Payer: Self-pay

## 2020-02-18 ENCOUNTER — Encounter: Payer: Self-pay | Admitting: Medical

## 2020-02-18 VITALS — BP 124/78 | HR 84 | Temp 98.1°F | Wt 260.2 lb

## 2020-02-18 DIAGNOSIS — H6501 Acute serous otitis media, right ear: Secondary | ICD-10-CM

## 2020-02-18 DIAGNOSIS — R059 Cough, unspecified: Secondary | ICD-10-CM

## 2020-02-18 DIAGNOSIS — J029 Acute pharyngitis, unspecified: Secondary | ICD-10-CM | POA: Diagnosis not present

## 2020-02-18 LAB — POC COVID19 BINAXNOW: SARS Coronavirus 2 Ag: NEGATIVE

## 2020-02-18 MED ORDER — HYDROCODONE-ACETAMINOPHEN 5-325 MG PO TABS: 1.0000 | ORAL_TABLET | Freq: Four times a day (QID) | ORAL | 0 refills | Status: DC | PRN

## 2020-02-18 MED ORDER — AMOXICILLIN 875 MG PO TABS: 875.0000 mg | ORAL_TABLET | Freq: Two times a day (BID) | ORAL | 0 refills | Status: DC

## 2020-02-18 NOTE — Patient Instructions (Signed)
Recommendations:  Salt water gargles several times per day  Hot fluids  Can use hot tea, honey and lemon mixture to soothe the throat  You can use Ibuprofen 200mg  over the counter, 3 tablets up to 3 times daily for throat pain  You can use chloraseptic spray to numb the throat  Begin Amoxicillin antibiotic twice daily for 10 days  You can use the Norco pain medication for a few days for worse pain.  caution - this can cause drowsiness  You can continue lidocaine rinse for the throat for pain  You can use Benadryl or Mucinex for mucous and congestion  If not much improved by Monday, or if much worse over the weekends such as fever over 103, redness or swelling of the external ear, then go to the hospital

## 2020-02-18 NOTE — Progress Notes (Signed)
Subjective:  Antonio Ellis is a 24 y.o. male who presents for Chief Complaint  Patient presents with  . other    ear pain rt. ear and ST, cough, since last Saturday      Here for ear pain.  He notes several days of ear pain, getting worse by the day in the right side, sore throat, cough, chills all for all over.  No fever, no nausea or vomiting.  His child has been sick with double ear infection recently.  He went to the emergency department twice in the past week for the same symptoms.  Strep test was negative.  Was given lidocaine for the throat and told to use Tylenol and rest.  No other aggravating or relieving factors.    No other c/o.  The following portions of the patient's history were reviewed and updated as appropriate: allergies, current medications, past family history, past medical history, past social history, past surgical history and problem list.  ROS Otherwise as in subjective above  Objective: BP 124/78   Pulse 84   Temp 98.1 F (36.7 C)   Wt 260 lb 3.2 oz (118 kg)   BMI 39.56 kg/m   General appearance: alert, no distress, well developed, well nourished, ill-appearing HEENT: normocephalic, sclerae anicteric, conjunctiva pink and moist, right TM with bulging red, left TM normal, nares patent, no discharge or erythema, pharynx with erythema and swollen tonsils, no exudate Oral cavity: MMM, no lesions Neck: supple, shotty tender anterior nodes, no thyromegaly, no masses Lungs: CTA bilaterally, no wheezes, rhonchi, or rales   Assessment: Encounter Diagnoses  Name Primary?  . Non-recurrent acute serous otitis media of right ear Yes  . Sore throat   . Cough      Plan: We discussed exam findings with obvious right otitis media which makes sense in light of his child's infection and negative Covid and negative strep swabs.  He had negative strep swab in the hospital.  Negative Covid test here today.   Patient Instructions  Recommendations:  Salt water  gargles several times per day  Hot fluids  Can use hot tea, honey and lemon mixture to soothe the throat  You can use Ibuprofen 200mg  over the counter, 3 tablets up to 3 times daily for throat pain  You can use chloraseptic spray to numb the throat  Begin Amoxicillin antibiotic twice daily for 10 days  You can use the Norco pain medication for a few days for worse pain.  caution - this can cause drowsiness  You can continue lidocaine rinse for the throat for pain  You can use Benadryl or Mucinex for mucous and congestion  If not much improved by Monday, or if much worse over the weekends such as fever over 103, redness or swelling of the external ear, then go to the hospital      Pieter was seen today for other.  Diagnoses and all orders for this visit:  Non-recurrent acute serous otitis media of right ear  Sore throat  Cough    Follow up: prn

## 2020-06-22 ENCOUNTER — Emergency Department (HOSPITAL_COMMUNITY): Payer: BC Managed Care – PPO

## 2020-06-22 ENCOUNTER — Other Ambulatory Visit (HOSPITAL_COMMUNITY): Payer: Self-pay | Admitting: Emergency Medicine

## 2020-06-22 ENCOUNTER — Encounter (HOSPITAL_COMMUNITY): Payer: Self-pay

## 2020-06-22 ENCOUNTER — Emergency Department (HOSPITAL_COMMUNITY)
Admission: EM | Admit: 2020-06-22 | Discharge: 2020-06-22 | Disposition: A | Discharge status: Home / Self Care | Payer: BC Managed Care – PPO | Attending: Emergency Medicine | Admitting: Emergency Medicine

## 2020-06-22 ENCOUNTER — Other Ambulatory Visit: Payer: Self-pay

## 2020-06-22 DIAGNOSIS — W228XXA Striking against or struck by other objects, initial encounter: Secondary | ICD-10-CM | POA: Diagnosis not present

## 2020-06-22 DIAGNOSIS — Z23 Encounter for immunization: Secondary | ICD-10-CM | POA: Diagnosis not present

## 2020-06-22 DIAGNOSIS — M7989 Other specified soft tissue disorders: Secondary | ICD-10-CM | POA: Diagnosis not present

## 2020-06-22 DIAGNOSIS — S6991XA Unspecified injury of right wrist, hand and finger(s), initial encounter: Secondary | ICD-10-CM | POA: Diagnosis not present

## 2020-06-22 DIAGNOSIS — S60414A Abrasion of right ring finger, initial encounter: Secondary | ICD-10-CM | POA: Insufficient documentation

## 2020-06-22 DIAGNOSIS — M25521 Pain in right elbow: Secondary | ICD-10-CM | POA: Insufficient documentation

## 2020-06-22 DIAGNOSIS — S60221A Contusion of right hand, initial encounter: Secondary | ICD-10-CM | POA: Insufficient documentation

## 2020-06-22 MED ORDER — CYCLOBENZAPRINE HCL 10 MG PO TABS: 10.0000 mg | ORAL_TABLET | Freq: Two times a day (BID) | ORAL | 0 refills | Status: DC | PRN

## 2020-06-22 MED ORDER — IBUPROFEN 800 MG PO TABS
800.0000 mg | ORAL_TABLET | Freq: Once | ORAL | Status: AC
  Administered 2020-06-22: 800 mg via ORAL
  Filled 2020-06-22: qty 1

## 2020-06-22 MED ORDER — TETANUS-DIPHTH-ACELL PERTUSSIS 5-2.5-18.5 LF-MCG/0.5 IM SUSY
0.5000 mL | PREFILLED_SYRINGE | Freq: Once | INTRAMUSCULAR | Status: AC
  Administered 2020-06-22: 0.5 mL via INTRAMUSCULAR
  Filled 2020-06-22: qty 0.5

## 2020-06-22 MED ORDER — IBUPROFEN 600 MG PO TABS: 600.0000 mg | ORAL_TABLET | Freq: Four times a day (QID) | ORAL | 0 refills | Status: DC | PRN
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
## 2020-06-23 MED FILL — CYCLOBENZAPRINE HCL 10 MG T: 10 | 10 days supply | Qty: 20 | Fill #0

## 2020-06-23 MED FILL — IBUPROFEN 600 MG TABLET: 600 | 5 days supply | Qty: 30 | Fill #0

## 2021-02-17 ENCOUNTER — Emergency Department (HOSPITAL_COMMUNITY): Payer: BC Managed Care – PPO

## 2021-02-17 ENCOUNTER — Emergency Department (HOSPITAL_COMMUNITY)
Admission: EM | Admit: 2021-02-17 | Discharge: 2021-02-17 | Disposition: A | Discharge status: Home / Self Care | Payer: BC Managed Care – PPO | Attending: Emergency Medicine | Admitting: Emergency Medicine

## 2021-02-17 ENCOUNTER — Encounter (HOSPITAL_COMMUNITY): Payer: Self-pay | Admitting: Emergency Medicine

## 2021-02-17 ENCOUNTER — Other Ambulatory Visit: Payer: Self-pay

## 2021-02-17 DIAGNOSIS — W208XXA Other cause of strike by thrown, projected or falling object, initial encounter: Secondary | ICD-10-CM | POA: Insufficient documentation

## 2021-02-17 DIAGNOSIS — F17210 Nicotine dependence, cigarettes, uncomplicated: Secondary | ICD-10-CM | POA: Insufficient documentation

## 2021-02-17 DIAGNOSIS — Z043 Encounter for examination and observation following other accident: Secondary | ICD-10-CM | POA: Diagnosis not present

## 2021-02-17 DIAGNOSIS — M79644 Pain in right finger(s): Secondary | ICD-10-CM | POA: Insufficient documentation

## 2021-02-17 DIAGNOSIS — S6991XA Unspecified injury of right wrist, hand and finger(s), initial encounter: Secondary | ICD-10-CM | POA: Diagnosis not present

## 2021-02-17 MED ORDER — ACETAMINOPHEN 325 MG PO TABS
650.0000 mg | ORAL_TABLET | Freq: Once | ORAL | Status: AC
  Administered 2021-02-17: 650 mg via ORAL
  Filled 2021-02-17: qty 2
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
## 2021-02-17 NOTE — Discharge Instructions (Signed)
Take Tylenol Motrin as needed for pain. Please wear the brace when possible as this can help with healing. Follow-up with orthopedic in about a week or so.

## 2021-02-28 ENCOUNTER — Telehealth: Payer: Self-pay | Admitting: Orthopedic Surgery

## 2021-02-28 NOTE — Telephone Encounter (Signed)
Patient called states he was seen in the ER on 02/17/21 AVS states he should see Dr. Aline Brochure in a week.  Patient called for the first time today, after review our doctor wasn't on call.  Dr. Alvan Dame with [facility] was on call that day.  Provided patient with Dr. Alvan Dame number to call [phone].  He said ok he will call them.

## 2021-03-06 DIAGNOSIS — M79644 Pain in right finger(s): Secondary | ICD-10-CM | POA: Diagnosis not present

## 2021-06-18 DIAGNOSIS — Z23 Encounter for immunization: Secondary | ICD-10-CM | POA: Diagnosis not present

## 2021-06-18 DIAGNOSIS — S61210A Laceration without foreign body of right index finger without damage to nail, initial encounter: Secondary | ICD-10-CM | POA: Diagnosis not present

## 2021-07-02 DIAGNOSIS — S93492A Sprain of other ligament of left ankle, initial encounter: Secondary | ICD-10-CM | POA: Diagnosis not present

## 2021-07-02 DIAGNOSIS — M25572 Pain in left ankle and joints of left foot: Secondary | ICD-10-CM | POA: Diagnosis not present

## 2021-07-02 DIAGNOSIS — M2391 Unspecified internal derangement of right knee: Secondary | ICD-10-CM | POA: Diagnosis not present

## 2021-07-11 DIAGNOSIS — M25561 Pain in right knee: Secondary | ICD-10-CM | POA: Diagnosis not present

## 2021-07-16 DIAGNOSIS — M25572 Pain in left ankle and joints of left foot: Secondary | ICD-10-CM | POA: Diagnosis not present

## 2021-07-16 DIAGNOSIS — M25561 Pain in right knee: Secondary | ICD-10-CM | POA: Diagnosis not present

## 2021-08-23 DIAGNOSIS — S83281A Other tear of lateral meniscus, current injury, right knee, initial encounter: Secondary | ICD-10-CM | POA: Diagnosis not present

## 2021-09-17 DIAGNOSIS — M794 Hypertrophy of (infrapatellar) fat pad: Secondary | ICD-10-CM | POA: Diagnosis not present

## 2021-09-17 DIAGNOSIS — Y999 Unspecified external cause status: Secondary | ICD-10-CM | POA: Diagnosis not present

## 2021-09-17 DIAGNOSIS — M65861 Other synovitis and tenosynovitis, right lower leg: Secondary | ICD-10-CM | POA: Diagnosis not present

## 2021-09-17 DIAGNOSIS — X58XXXA Exposure to other specified factors, initial encounter: Secondary | ICD-10-CM | POA: Diagnosis not present

## 2021-09-17 DIAGNOSIS — G8918 Other acute postprocedural pain: Secondary | ICD-10-CM | POA: Diagnosis not present

## 2021-09-17 DIAGNOSIS — M659 Synovitis and tenosynovitis, unspecified: Secondary | ICD-10-CM | POA: Diagnosis not present

## 2021-09-17 DIAGNOSIS — S83261A Peripheral tear of lateral meniscus, current injury, right knee, initial encounter: Secondary | ICD-10-CM | POA: Diagnosis not present

## 2021-12-05 ENCOUNTER — Encounter: Payer: Self-pay | Admitting: Internal Medicine

## 2022-01-08 ENCOUNTER — Encounter: Payer: Self-pay | Admitting: Internal Medicine

## 2022-01-24 DIAGNOSIS — Z9889 Other specified postprocedural states: Secondary | ICD-10-CM | POA: Diagnosis not present

## 2022-05-15 ENCOUNTER — Ambulatory Visit
Admission: EM | Admit: 2022-05-15 | Discharge: 2022-05-15 | Disposition: A | Discharge status: Home / Self Care | Payer: BC Managed Care – PPO | Attending: Internal Medicine | Admitting: Internal Medicine

## 2022-05-15 ENCOUNTER — Encounter: Payer: Self-pay | Admitting: Emergency Medicine

## 2022-05-15 DIAGNOSIS — R051 Acute cough: Secondary | ICD-10-CM | POA: Insufficient documentation

## 2022-05-15 DIAGNOSIS — J029 Acute pharyngitis, unspecified: Secondary | ICD-10-CM

## 2022-05-15 DIAGNOSIS — Z1152 Encounter for screening for COVID-19: Secondary | ICD-10-CM | POA: Insufficient documentation

## 2022-05-15 DIAGNOSIS — F17210 Nicotine dependence, cigarettes, uncomplicated: Secondary | ICD-10-CM | POA: Insufficient documentation

## 2022-05-15 DIAGNOSIS — R0982 Postnasal drip: Secondary | ICD-10-CM | POA: Insufficient documentation

## 2022-05-15 DIAGNOSIS — J00 Acute nasopharyngitis [common cold]: Secondary | ICD-10-CM | POA: Insufficient documentation

## 2022-05-15 LAB — POCT INFLUENZA A/B
Influenza A, POC: NEGATIVE
Influenza B, POC: NEGATIVE

## 2022-05-15 LAB — POCT RAPID STREP A (OFFICE): Rapid Strep A Screen: NEGATIVE

## 2022-05-15 MED ORDER — BENZONATATE 200 MG PO CAPS: 200.0000 mg | ORAL_CAPSULE | Freq: Three times a day (TID) | ORAL | 0 refills | Status: AC | PRN

## 2022-05-15 MED ORDER — FEXOFENADINE-PSEUDOEPHED ER 60-120 MG PO TB12: 1.0000 | ORAL_TABLET | Freq: Two times a day (BID) | ORAL | 0 refills | Status: AC

## 2022-05-15 NOTE — Discharge Instructions (Addendum)
Stay home until the Covid test comes back. If positive you need to quarantine for 5 days from onset of symptoms,  and may return to work wearing a mask for 5 more days. If negative this is some other viral cold which may last up to 3 weeks, but should get better after 10 days.  Work on stop smoking so you dont end up with bronchitis or pneumonia

## 2022-05-15 NOTE — ED Triage Notes (Signed)
Right ear drainage down throat x 2 days.  States this is causing his throat to hurt on the right side.  Runny nose, sneezing.  Has been taking nyquil

## 2022-05-15 NOTE — ED Provider Notes (Signed)
[facility]    CSN: [id number] Arrival date & time: 05/15/22  1435      History   Chief Complaint No chief complaint on file.   HPI Antonio Ellis is a 27 y.o. male who presents with R side ST which he feels is from his R ear draining. Has also had rhinitis and sneezing. Has been taking Nyquil. Has rhinitis on R side only. Has felt hot, but not been sweating or having body aches. HA onset yesterday. Appetite is decreased. He is a smoker.     Past Medical History:  Diagnosis Date   Allergy    RHINITIS   Obesity     Patient Active Problem List   Diagnosis Date Noted   Screening for diabetes mellitus 08/03/2019   Erectile dysfunction 99991111   Marital conflict [id number]   Counseled about COVID-19 virus infection 08/03/2019   Need for hepatitis A vaccination 04/19/2011   Need for HPV vaccination 04/19/2011   Obesity 10/11/2010    Past Surgical History:  Procedure Laterality Date   NO PAST SURGERIES  07/2019       Home Medications    Prior to Admission medications   Medication Sig Start Date End Date Taking? Authorizing Provider  benzonatate (TESSALON) 200 MG capsule Take 1 capsule (200 mg total) by mouth 3 (three) times daily as needed for cough. 05/15/22  Yes Rodriguez-Southworth, Sunday Spillers, PA-C  fexofenadine-pseudoephedrine (ALLEGRA-D) 60-120 MG 12 hr tablet Take 1 tablet by mouth every 12 (twelve) hours. 05/15/22  Yes Rodriguez-Southworth, Sunday Spillers, PA-C    Family History Family History  Problem Relation Age of Onset   Hypertension Father    Heart disease Father 91       MI   Hypertension Paternal Uncle    Seizures Paternal Grandmother    Hypertension Paternal Grandmother    Heart disease Paternal Grandmother    Seizures Paternal Grandfather    Hypertension Maternal Grandmother    Hyperlipidemia Maternal Grandmother    Diabetes Maternal Grandmother    Cancer Maternal Grandfather        bladder   Hypertension Maternal Grandfather     Hyperlipidemia Maternal Grandfather    Stroke Neg Hx     Social History Social History   Tobacco Use   Smoking status: Every Day    Types: Cigarettes   Smokeless tobacco: Never  Vaping Use   Vaping Use: Never used  Substance Use Topics   Alcohol use: Yes    Alcohol/week: 2.0 standard drinks of alcohol    Types: 2 Cans of beer per week    Comment: occ   Drug use: Yes    Types: Marijuana    Comment: denies at this time     Allergies   Patient has no known allergies.   Review of Systems Review of Systems  Constitutional:  Positive for chills. Negative for fever.  HENT:  Positive for congestion, postnasal drip, rhinorrhea and sore throat. Negative for ear discharge and ear pain.   Respiratory:  Positive for cough.   Musculoskeletal:  Negative for myalgias.  Neurological:  Positive for headaches.     Physical Exam Triage Vital Signs ED Triage Vitals  Enc Vitals Group     BP 05/15/22 1445 119/74     Pulse Rate 05/15/22 1445 92     Resp 05/15/22 1445 18     Temp 05/15/22 1445 98.3 F (36.8 C)     Temp Source 05/15/22 1445 Oral     SpO2 05/15/22 1445 96 %  Weight --      Height --      Head Circumference --      Peak Flow --      Pain Score 05/15/22 1446 3     Pain Loc --      Pain Edu? --      Excl. in [place]? --    No data found.  Updated Vital Signs BP 119/74 (BP Location: Right Arm)   Pulse 92   Temp 98.3 F (36.8 C) (Oral)   Resp 18   SpO2 96%   Visual Acuity Right Eye Distance:   Left Eye Distance:   Bilateral Distance:    Right Eye Near:   Left Eye Near:    Bilateral Near:     Physical Exam Vitals and nursing note reviewed.  Constitutional:      General: He is not in acute distress.    Appearance: He is obese.  HENT:     Right Ear: Tympanic membrane, ear canal and external ear normal.     Left Ear: Tympanic membrane, ear canal and external ear normal.     Nose: Congestion and rhinorrhea present.     Mouth/Throat:     Mouth: Mucous  membranes are moist.     Pharynx: Posterior oropharyngeal erythema present.     Comments: R tonsil is a little larger than the L and is more erythematous. Uvula is mid line and there is no signs of peritonsillar abscess Eyes:     General: No scleral icterus.    Conjunctiva/sclera: Conjunctivae normal.  Cardiovascular:     Rate and Rhythm: Normal rate and regular rhythm.     Heart sounds: No murmur heard. Pulmonary:     Effort: Pulmonary effort is normal.     Breath sounds: Normal breath sounds.  Musculoskeletal:        General: Normal range of motion.     Cervical back: Neck supple.  Lymphadenopathy:     Cervical: No cervical adenopathy.  Neurological:     Mental Status: He is alert and oriented to person, place, and time.     Gait: Gait normal.  Psychiatric:        Mood and Affect: Mood normal.        Behavior: Behavior normal.        Thought Content: Thought content normal.        Judgment: Judgment normal.      UC Treatments / Results  Labs (all labs ordered are listed, but only abnormal results are displayed) Labs Reviewed  SARS CORONAVIRUS 2 (TAT 6-24 HRS)  CULTURE, GROUP A STREP [facility])  POCT RAPID STREP A (OFFICE)  POCT INFLUENZA A/B    EKG   Radiology No results found.  Procedures Procedures (including critical care time)  Medications Ordered in UC Medications - No data to display  Initial Impression / Assessment and Plan / UC Course  I have reviewed the triage vital signs and the nursing notes.  Pertinent labs results that were available during my care of the patient were reviewed by me and considered in my medical decision making (see chart for details).   URI Pharyngitis  I placed him on Allegra D and Tessalon as noted. Throat culture and Covid test sent out, and we will call them if they come back positive.   Final Clinical Impressions(s) / UC Diagnoses   Final diagnoses:  Acute cough  Acute pharyngitis, unspecified etiology  Post-nasal  drainage     Discharge Instructions  Stay home until the Covid test comes back. If positive you need to quarantine for 5 days from onset of symptoms,  and may return to work wearing a mask for 5 more days. If negative this is some other viral cold which may last up to 3 weeks, but should get better after 10 days.  Work on stop smoking so you dont end up with bronchitis or pneumonia      ED Prescriptions     Medication Sig Dispense Auth. Provider   fexofenadine-pseudoephedrine (ALLEGRA-D) 60-120 MG 12 hr tablet Take 1 tablet by mouth every 12 (twelve) hours. 30 tablet Rodriguez-Southworth, Alexy Bringle, PA-C   benzonatate (TESSALON) 200 MG capsule Take 1 capsule (200 mg total) by mouth 3 (three) times daily as needed for cough. 30 capsule Rodriguez-Southworth, Sunday Spillers, PA-C      PDMP not reviewed this encounter.   Shelby Mattocks, PA-C 05/15/22 1526

## 2022-05-16 LAB — SARS CORONAVIRUS 2 (TAT 6-24 HRS): SARS Coronavirus 2: NEGATIVE

## 2022-05-18 LAB — CULTURE, GROUP A STREP (THRC)

## 2022-05-30 ENCOUNTER — Encounter: Payer: Self-pay | Admitting: Radiology

## 2022-07-03 ENCOUNTER — Other Ambulatory Visit: Payer: Self-pay

## 2022-07-03 ENCOUNTER — Ambulatory Visit
Admission: EM | Admit: 2022-07-03 | Discharge: 2022-07-03 | Disposition: A | Discharge status: Home / Self Care | Payer: BC Managed Care – PPO | Attending: Nurse Practitioner | Admitting: Nurse Practitioner

## 2022-07-03 DIAGNOSIS — J069 Acute upper respiratory infection, unspecified: Secondary | ICD-10-CM

## 2022-07-03 DIAGNOSIS — Z1152 Encounter for screening for COVID-19: Secondary | ICD-10-CM | POA: Diagnosis present

## 2022-07-03 DIAGNOSIS — Z20818 Contact with and (suspected) exposure to other bacterial communicable diseases: Secondary | ICD-10-CM | POA: Diagnosis present

## 2022-07-03 LAB — POCT INFLUENZA A/B
Influenza A, POC: NEGATIVE
Influenza B, POC: NEGATIVE

## 2022-07-03 LAB — POCT RAPID STREP A (OFFICE): Rapid Strep A Screen: NEGATIVE

## 2022-07-03 MED ORDER — FLUTICASONE PROPIONATE 50 MCG/ACT NA SUSP: 2.0000 | Freq: Every day | NASAL | 0 refills | Status: DC

## 2022-07-03 MED ORDER — LIDOCAINE VISCOUS HCL 2 % MT SOLN: 5.0000 mL | Freq: Four times a day (QID) | OROMUCOSAL | 0 refills | Status: AC | PRN

## 2022-07-03 MED ORDER — PSEUDOEPH-BROMPHEN-DM 30-2-10 MG/5ML PO SYRP: 5.0000 mL | ORAL_SOLUTION | Freq: Four times a day (QID) | ORAL | 0 refills | Status: AC | PRN

## 2022-07-03 NOTE — Discharge Instructions (Addendum)
The rapid strep test and influenza test were negative.  A throat culture and COVID test are pending.  You are able to receive Paxlovid if your COVID test is positive.  Medication will need to be started no later than 07/04/2022.  You will be contacted if the pending test results are positive. Take medication as prescribed.  May take over-the-counter Tylenol or ibuprofen as needed for pain, fever, or general discomfort. Increase fluids and allow for plenty of rest. Recommend warm salt water gargles 3-4 times daily as needed for throat pain or discomfort. Try to decrease smoking while your cough persist as this may make your symptoms worse. Recommend using a humidifier at nighttime during sleep or sleeping elevated on pillows while cough symptoms persist. If your pending test results are negative, your symptoms most likely have been caused by a virus.  Please be advised that a virus can last from 7 to 14 days.  If symptoms suddenly worsen before that time or if you develop new symptoms, please follow-up in this clinic or with your primary care physician for further evaluation. Follow-up as needed.

## 2022-07-03 NOTE — ED Triage Notes (Signed)
Pt reports he has been having chills, body aches, throat burns, mucus, coughing, and headache x 4 days Pt didn't take any cold meds.   Exposed to strep by his son

## 2022-07-03 NOTE — ED Provider Notes (Signed)
[facility]    CSN: [id number] Arrival date & time: 07/03/22  1542      History   Chief Complaint Chief Complaint  Patient presents with   Sore Throat   URI    HPI Antonio Ellis is a 27 y.o. male.    Sore Throat  URI   Past Medical History:  Diagnosis Date   Allergy    RHINITIS   Obesity     Patient Active Problem List   Diagnosis Date Noted   Screening for diabetes mellitus 08/03/2019   Erectile dysfunction 99991111   Marital conflict [id number]   Counseled about COVID-19 virus infection 08/03/2019   Need for hepatitis A vaccination 04/19/2011   Need for HPV vaccination 04/19/2011   Obesity 10/11/2010    Past Surgical History:  Procedure Laterality Date   NO PAST SURGERIES  07/2019       Home Medications    Prior to Admission medications   Medication Sig Start Date End Date Taking? Authorizing Provider  brompheniramine-pseudoephedrine-DM 30-2-10 MG/5ML syrup Take 5 mLs by mouth 4 (four) times daily as needed. 07/03/22  Yes Sayward Horvath-Warren, Alda Lea, NP  fluticasone (FLONASE) 50 MCG/ACT nasal spray Place 2 sprays into both nostrils daily. 07/03/22  Yes Waris Rodger-Warren, Alda Lea, NP  lidocaine (XYLOCAINE) 2 % solution Use as directed 5 mLs in the mouth or throat every 6 (six) hours as needed for mouth pain. Gargle and spit 5 mL every 6 hours as needed for throat pain or discomfort. 07/03/22  Yes Chenille Toor-Warren, Alda Lea, NP  benzonatate (TESSALON) 200 MG capsule Take 1 capsule (200 mg total) by mouth 3 (three) times daily as needed for cough. 05/15/22   Rodriguez-Southworth, Sunday Spillers, PA-C  fexofenadine-pseudoephedrine (ALLEGRA-D) 60-120 MG 12 hr tablet Take 1 tablet by mouth every 12 (twelve) hours. 05/15/22   Rodriguez-Southworth, Sunday Spillers, PA-C    Family History Family History  Problem Relation Age of Onset   Hypertension Father    Heart disease Father 33       MI   Hypertension Paternal Uncle    Seizures Paternal Grandmother    Hypertension  Paternal Grandmother    Heart disease Paternal Grandmother    Seizures Paternal Grandfather    Hypertension Maternal Grandmother    Hyperlipidemia Maternal Grandmother    Diabetes Maternal Grandmother    Cancer Maternal Grandfather        bladder   Hypertension Maternal Grandfather    Hyperlipidemia Maternal Grandfather    Stroke Neg Hx     Social History Social History   Tobacco Use   Smoking status: Every Day    Types: Cigarettes   Smokeless tobacco: Never  Vaping Use   Vaping Use: Never used  Substance Use Topics   Alcohol use: Yes    Alcohol/week: 2.0 standard drinks of alcohol    Types: 2 Cans of beer per week    Comment: occ   Drug use: Yes    Types: Marijuana    Comment: denies at this time     Allergies   Patient has no known allergies.   Review of Systems Review of Systems Per HPI  Physical Exam Triage Vital Signs ED Triage Vitals [07/03/22 1552]  Enc Vitals Group     BP 133/82     Pulse Rate 94     Resp 18     Temp 98.7 F (37.1 C)     Temp Source Oral     SpO2 97 %     Weight  Height      Head Circumference      Peak Flow      Pain Score      Pain Loc      Pain Edu?      Excl. in [place]?    No data found.  Updated Vital Signs BP 133/82 (BP Location: Right Arm)   Pulse 94   Temp 98.7 F (37.1 C) (Oral)   Resp 18   SpO2 97%   Visual Acuity Right Eye Distance:   Left Eye Distance:   Bilateral Distance:    Right Eye Near:   Left Eye Near:    Bilateral Near:     Physical Exam Vitals and nursing note reviewed.  Constitutional:      Appearance: He is well-developed. He is not toxic-appearing.  HENT:     Head: Normocephalic.     Right Ear: Tympanic membrane and ear canal normal.     Left Ear: Tympanic membrane and ear canal normal.     Nose: Congestion present. No rhinorrhea.     Mouth/Throat:     Mouth: Mucous membranes are moist.     Pharynx: Pharyngeal swelling and posterior oropharyngeal erythema present.     Tonsils:  2+ on the right. 2+ on the left.  Eyes:     Conjunctiva/sclera: Conjunctivae normal.     Pupils: Pupils are equal, round, and reactive to light.  Cardiovascular:     Rate and Rhythm: Normal rate and regular rhythm.     Heart sounds: Normal heart sounds.  Pulmonary:     Effort: Pulmonary effort is normal. No respiratory distress.     Breath sounds: Normal breath sounds. No stridor. No wheezing, rhonchi or rales.  Abdominal:     General: Bowel sounds are normal. There is no distension.     Palpations: Abdomen is soft.     Tenderness: There is no abdominal tenderness.  Musculoskeletal:     Cervical back: Normal range of motion.  Lymphadenopathy:     Cervical: No cervical adenopathy.  Skin:    General: Skin is warm and dry.  Neurological:     General: No focal deficit present.     Mental Status: He is alert and oriented to person, place, and time.  Psychiatric:        Mood and Affect: Mood normal.        Behavior: Behavior normal.      UC Treatments / Results  Labs (all labs ordered are listed, but only abnormal results are displayed) Labs Reviewed  CULTURE, GROUP A STREP ([place])  SARS CORONAVIRUS 2 (TAT 6-24 HRS)  POCT RAPID STREP A (OFFICE)  POCT INFLUENZA A/B    EKG   Radiology No results found.  Procedures Procedures (including critical care time)  Medications Ordered in UC Medications - No data to display  Initial Impression / Assessment and Plan / UC Course  I have reviewed the triage vital signs and the nursing notes.  Pertinent labs & imaging results that were available during my care of the patient were reviewed by me and considered in my medical decision making (see chart for details).  The patient is well-appearing, he is in no acute distress, vital signs are stable.  Rapid strep test is negative along with a negative influenza test.  Throat culture and COVID test are pending. Patient is able to receive Paxlovid if his COVID test is positive, will need  to start medication no later than/4/24.  Suspect a viral upper respiratory  infection with cough.  Will provide symptomatic treatment at this time.  For the patient's throat pain, viscous lidocaine 2% was prescribed, for his cough, Bromfed-DM was prescribed, and for nasal congestion and runny nose, patient was prescribed fluticasone 50 micro nasal spray.  Supportive care recommendations were provided and discussed with the patient to include increasing fluids, allowing for plenty of rest, and over-the-counter analgesics for pain or discomfort.  Discussed viral etiology with the patient and when follow-up would be indicated.  Patient is in agreement with this plan of care and verbalizes understanding.  All questions were answered.  Patient stable for discharge.  Work note was provided.   Final Clinical Impressions(s) / UC Diagnoses   Final diagnoses:  Exposure to strep throat  Viral upper respiratory tract infection with cough  Encounter for screening for COVID-19     Discharge Instructions      The rapid strep test and influenza test were negative.  A throat culture and COVID test are pending.  You are able to receive Paxlovid if your COVID test is positive.  Medication will need to be started no later than 07/04/2022.  You will be contacted if the pending test results are positive. Take medication as prescribed.  May take over-the-counter Tylenol or ibuprofen as needed for pain, fever, or general discomfort. Increase fluids and allow for plenty of rest. Recommend warm salt water gargles 3-4 times daily as needed for throat pain or discomfort. Try to decrease smoking while your cough persist as this may make your symptoms worse. Recommend using a humidifier at nighttime during sleep or sleeping elevated on pillows while cough symptoms persist. If your pending test results are negative, your symptoms most likely have been caused by a virus.  Please be advised that a virus can last from 7 to 14 days.   If symptoms suddenly worsen before that time or if you develop new symptoms, please follow-up in this clinic or with your primary care physician for further evaluation. Follow-up as needed.                                                                                                                                               ED Prescriptions     Medication Sig Dispense Auth. Provider   lidocaine (XYLOCAINE) 2 % solution Use as directed 5 mLs in the mouth or throat every 6 (six) hours as needed for mouth pain. Gargle and spit 5 mL every 6 hours as needed for throat pain or discomfort. 100 mL Yemaya Barnier-Warren, Alda Lea, NP   brompheniramine-pseudoephedrine-DM 30-2-10 MG/5ML syrup Take 5 mLs by mouth 4 (four) times daily as needed. 140 mL Cj Beecher-Warren, Alda Lea, NP   fluticasone (FLONASE) 50 MCG/ACT nasal spray Place 2 sprays into both nostrils daily. 16 g Doniel Maiello-Warren, Alda Lea, NP      PDMP not reviewed this encounter.  Tish Men, NP 07/03/22 [phone]

## 2022-07-04 ENCOUNTER — Emergency Department (HOSPITAL_COMMUNITY): Payer: BC Managed Care – PPO

## 2022-07-04 ENCOUNTER — Other Ambulatory Visit: Payer: Self-pay

## 2022-07-04 ENCOUNTER — Encounter (HOSPITAL_COMMUNITY): Payer: Self-pay | Admitting: Emergency Medicine

## 2022-07-04 ENCOUNTER — Emergency Department (HOSPITAL_COMMUNITY)
Admission: EM | Admit: 2022-07-04 | Discharge: 2022-07-04 | Disposition: A | Discharge status: Home / Self Care | Payer: BC Managed Care – PPO | Attending: Emergency Medicine | Admitting: Emergency Medicine

## 2022-07-04 DIAGNOSIS — S61451A Open bite of right hand, initial encounter: Secondary | ICD-10-CM | POA: Insufficient documentation

## 2022-07-04 DIAGNOSIS — Z23 Encounter for immunization: Secondary | ICD-10-CM | POA: Insufficient documentation

## 2022-07-04 DIAGNOSIS — W540XXA Bitten by dog, initial encounter: Secondary | ICD-10-CM | POA: Diagnosis not present

## 2022-07-04 LAB — SARS CORONAVIRUS 2 (TAT 6-24 HRS): SARS Coronavirus 2: NEGATIVE

## 2022-07-04 MED ORDER — AMOXICILLIN-POT CLAVULANATE 875-125 MG PO TABS: 1.0000 | ORAL_TABLET | Freq: Two times a day (BID) | ORAL | 0 refills | Status: AC

## 2022-07-04 MED ORDER — TETANUS-DIPHTH-ACELL PERTUSSIS 5-2.5-18.5 LF-MCG/0.5 IM SUSY
0.5000 mL | PREFILLED_SYRINGE | Freq: Once | INTRAMUSCULAR | Status: AC
  Administered 2022-07-04: 0.5 mL via INTRAMUSCULAR
  Filled 2022-07-04: qty 0.5

## 2022-07-04 NOTE — ED Triage Notes (Signed)
Pt reports dog bite to right hand, he was bit by his dog while attempting to break up a dog fight, rabies vaccines are not current

## 2022-07-04 NOTE — ED Provider Notes (Signed)
[facility] Provider Note   CSN: [id number] Arrival date & time: 07/04/22  0005     History  Chief Complaint  Patient presents with   Animal Bite    Antonio Ellis is a 27 y.o. male.  Dents to the emergency department for evaluation of bite to right hand.  Patient reports that he was bitten by his dog tonight when he was attempting to break up a fight.  Vaccination status of the dog is not up-to-date.  Patient is unaware when his last tetanus was.       Home Medications Prior to Admission medications   Medication Sig Start Date End Date Taking? Authorizing Provider  amoxicillin-clavulanate (AUGMENTIN) 875-125 MG tablet Take 1 tablet by mouth every 12 (twelve) hours. 07/04/22  Yes Kyeshia Zinn, Gwenyth Allegra, MD  benzonatate (TESSALON) 200 MG capsule Take 1 capsule (200 mg total) by mouth 3 (three) times daily as needed for cough. 05/15/22   Rodriguez-Southworth, Sunday Spillers, PA-C  brompheniramine-pseudoephedrine-DM 30-2-10 MG/5ML syrup Take 5 mLs by mouth 4 (four) times daily as needed. 07/03/22   Leath-Warren, Alda Lea, NP  fexofenadine-pseudoephedrine (ALLEGRA-D) 60-120 MG 12 hr tablet Take 1 tablet by mouth every 12 (twelve) hours. 05/15/22   Rodriguez-Southworth, Sunday Spillers, PA-C  fluticasone (FLONASE) 50 MCG/ACT nasal spray Place 2 sprays into both nostrils daily. 07/03/22   Leath-Warren, Alda Lea, NP  lidocaine (XYLOCAINE) 2 % solution Use as directed 5 mLs in the mouth or throat every 6 (six) hours as needed for mouth pain. Gargle and spit 5 mL every 6 hours as needed for throat pain or discomfort. 07/03/22   Leath-Warren, Alda Lea, NP      Allergies    Patient has no known allergies.    Review of Systems   Review of Systems  Physical Exam Updated Vital Signs BP 119/68   Pulse 98   Temp 99.1 F (37.3 C) (Oral)   Resp 17   Ht 5\' 8"  (1.727 m)   Wt 117.9 kg   SpO2 99%   BMI 39.53 kg/m  Physical Exam Constitutional:      Appearance:  Normal appearance.  Musculoskeletal:     Right hand: Swelling, laceration (Dorsal aspect over second metacarpal) and tenderness present. No deformity. Normal range of motion.  Skin:    Findings: Wound (Dorsal aspect right hand, very small laceration palmar aspect at base of thumb) present.  Neurological:     Mental Status: He is alert.     Sensory: Sensation is intact.     Motor: Motor function is intact.     ED Results / Procedures / Treatments   Labs (all labs ordered are listed, but only abnormal results are displayed) Labs Reviewed - No data to display  EKG None  Radiology DG Hand Complete Right  Result Date: 07/04/2022 CLINICAL DATA:  Dog bite EXAM: RIGHT HAND - COMPLETE 3+ VIEW COMPARISON:  None Available. FINDINGS: Normal alignment. No acute fracture or dislocation. Remote healed right fifth metacarpal fracture with residual volar angulatory deformity. Soft tissue swelling of the thenar eminence and dorsum of the right hand with punctate focus of subcutaneous gas within the dorsum. No retained radiopaque foreign body. IMPRESSION: 1. Soft tissue swelling and subcutaneous gas. No retained radiopaque foreign body. 2. No acute fracture or dislocation. Electronically Signed   By: Fidela Salisbury M.D.   On: 07/04/2022 02:15    Procedures Procedures    Medications Ordered in ED Medications  Tdap (BOOSTRIX) injection 0.5 mL (  has no administration in time range)    ED Course/ Medical Decision Making/ A&P                             Medical Decision Making Amount and/or Complexity of Data Reviewed Radiology: ordered.  Risk Prescription drug management.   Presents with dog bite to right hand.  Patient reports that it was his dog that bit him.  Dog is not vaccinated for rabies, however.  Patient given tetanus booster.  X-ray without foreign body, no fracture.  Patient with a deep puncture to the back of the hand and a superficial puncture to the palmar aspect at the thumb.  No  repair performed because of risk of infection.  Empiric antibiotics.  Low risk for rabies, will alert animal control.        Final Clinical Impression(s) / ED Diagnoses Final diagnoses:  Dog bite of right hand, initial encounter    Rx / DC Orders ED Discharge Orders          Ordered    amoxicillin-clavulanate (AUGMENTIN) 875-125 MG tablet  Every 12 hours        07/04/22 0232              Orpah Greek, MD 07/04/22 [phone]

## 2022-07-06 LAB — CULTURE, GROUP A STREP (THRC)

## 2023-01-02 ENCOUNTER — Other Ambulatory Visit: Payer: Self-pay

## 2023-01-02 ENCOUNTER — Ambulatory Visit
Admission: EM | Admit: 2023-01-02 | Discharge: 2023-01-02 | Disposition: A | Discharge status: Home / Self Care | Payer: Self-pay | Attending: Nurse Practitioner | Admitting: Nurse Practitioner

## 2023-01-02 DIAGNOSIS — R21 Rash and other nonspecific skin eruption: Secondary | ICD-10-CM

## 2023-01-02 MED ORDER — PREDNISONE 20 MG PO TABS: 40.0000 mg | ORAL_TABLET | Freq: Every day | ORAL | 0 refills | Status: AC

## 2023-01-02 MED ORDER — TRIAMCINOLONE ACETONIDE 0.1 % EX CREA: 1.0000 | TOPICAL_CREAM | Freq: Two times a day (BID) | CUTANEOUS | 0 refills | Status: AC

## 2023-01-02 MED ORDER — DEXAMETHASONE SODIUM PHOSPHATE 10 MG/ML IJ SOLN
10.0000 mg | INTRAMUSCULAR | Status: AC
  Administered 2023-01-02: 10 mg via INTRAMUSCULAR

## 2023-01-02 NOTE — Discharge Instructions (Signed)
Take medication as prescribed. May also take over-the-counter Zyrtec during the daytime and Benadryl at bedtime to help with itching. Avoid hot baths or showers while symptoms persist.  Recommend taking lukewarm baths. May apply cool cloths to the area to help with itching or discomfort. Avoid scratching, rubbing, or manipulating the areas while symptoms persist. Recommend Aveeno colloidal oatmeal bath to use to help with drying and itching. If symptoms do not improve with this treatment, you may follow-up in this clinic or with your primary care physician for further evaluation. Follow-up as needed.

## 2023-01-02 NOTE — ED Provider Notes (Signed)
[facility]    CSN: [id number] Arrival date & time: 01/02/23  1908      History   Chief Complaint Chief Complaint  Patient presents with   Poison Ivy    HPI Antonio Ellis is a 27 y.o. male.   The history is provided by the patient.   Patient presents for complaints of rash to his arms that started today after an exposure to poison ivy.  Patient states he was out cutting down a tree when he was exposed.  Patient reports the rash has been itchy.  He denies fever, chills, oozing, or drainage from the rash.  Patient reports he has not used any medication for his symptoms.  Further denies exposure to new soaps, medications, lotions, foods, or detergents.  Past Medical History:  Diagnosis Date   Allergy    RHINITIS   Obesity     Patient Active Problem List   Diagnosis Date Noted   Screening for diabetes mellitus 08/03/2019   Erectile dysfunction 08/03/2019   Marital conflict 08/03/2019   Counseled about COVID-19 virus infection 08/03/2019   Need for hepatitis A vaccination 04/19/2011   Need for HPV vaccination 04/19/2011   Obesity 10/11/2010    Past Surgical History:  Procedure Laterality Date   NO PAST SURGERIES  07/2019       Home Medications    Prior to Admission medications   Medication Sig Start Date End Date Taking? Authorizing Provider  predniSONE (DELTASONE) 20 MG tablet Take 2 tablets (40 mg total) by mouth daily with breakfast for 5 days. 01/02/23 01/07/23 Yes Kashton Mcartor-Warren, Sadie Haber, NP  triamcinolone cream (KENALOG) 0.1 % Apply 1 Application topically 2 (two) times daily. 01/02/23  Yes Quana Chamberlain-Warren, Sadie Haber, NP  amoxicillin-clavulanate (AUGMENTIN) 875-125 MG tablet Take 1 tablet by mouth every 12 (twelve) hours. 07/04/22   Gilda Crease, MD  benzonatate (TESSALON) 200 MG capsule Take 1 capsule (200 mg total) by mouth 3 (three) times daily as needed for cough. 05/15/22   Rodriguez-Southworth, Nettie Elm, PA-C   brompheniramine-pseudoephedrine-DM 30-2-10 MG/5ML syrup Take 5 mLs by mouth 4 (four) times daily as needed. 07/03/22   Kynlee Koenigsberg-Warren, Sadie Haber, NP  fexofenadine-pseudoephedrine (ALLEGRA-D) 60-120 MG 12 hr tablet Take 1 tablet by mouth every 12 (twelve) hours. 05/15/22   Rodriguez-Southworth, Nettie Elm, PA-C  fluticasone (FLONASE) 50 MCG/ACT nasal spray Place 2 sprays into both nostrils daily. 07/03/22   Jonahtan Manseau-Warren, Sadie Haber, NP  lidocaine (XYLOCAINE) 2 % solution Use as directed 5 mLs in the mouth or throat every 6 (six) hours as needed for mouth pain. Gargle and spit 5 mL every 6 hours as needed for throat pain or discomfort. 07/03/22   Catarino Vold-Warren, Sadie Haber, NP    Family History Family History  Problem Relation Age of Onset   Hypertension Father    Heart disease Father 28       MI   Hypertension Paternal Uncle    Seizures Paternal Grandmother    Hypertension Paternal Grandmother    Heart disease Paternal Grandmother    Seizures Paternal Grandfather    Hypertension Maternal Grandmother    Hyperlipidemia Maternal Grandmother    Diabetes Maternal Grandmother    Cancer Maternal Grandfather        bladder   Hypertension Maternal Grandfather    Hyperlipidemia Maternal Grandfather    Stroke Neg Hx     Social History Social History   Tobacco Use   Smoking status: Every Day    Types: Cigarettes   Smokeless tobacco:  Never  Vaping Use   Vaping status: Never Used  Substance Use Topics   Alcohol use: Yes    Alcohol/week: 2.0 standard drinks of alcohol    Types: 2 Cans of beer per week    Comment: occ   Drug use: Yes    Types: Marijuana    Comment: denies at this time     Allergies   Patient has no known allergies.   Review of Systems Review of Systems Per HPI  Physical Exam Triage Vital Signs ED Triage Vitals [01/02/23 1920]  Encounter Vitals Group     BP 137/83     Systolic BP Percentile      Diastolic BP Percentile      Pulse Rate 92     Resp 20     Temp 97.7 F  (36.5 C)     Temp Source Oral     SpO2 97 %     Weight      Height      Head Circumference      Peak Flow      Pain Score 0     Pain Loc      Pain Education      Exclude from Growth Chart    No data found.  Updated Vital Signs BP 137/83 (BP Location: Right Arm)   Pulse 92   Temp 97.7 F (36.5 C) (Oral)   Resp 20   SpO2 97%   Visual Acuity Right Eye Distance:   Left Eye Distance:   Bilateral Distance:    Right Eye Near:   Left Eye Near:    Bilateral Near:     Physical Exam Vitals and nursing note reviewed.  Constitutional:      General: He is not in acute distress.    Appearance: Normal appearance.  HENT:     Head: Normocephalic.  Eyes:     Extraocular Movements: Extraocular movements intact.     Pupils: Pupils are equal, round, and reactive to light.  Pulmonary:     Effort: Pulmonary effort is normal.  Musculoskeletal:     Cervical back: Normal range of motion.  Skin:    General: Skin is warm and dry.     Findings: Erythema and rash present. Rash is macular and papular.     Comments: Erythematous maculopapular rash noted to the bilateral upper extremities.  Rashes and no congruent pattern.  There is no oozing, fluctuance, or drainage present.  Neurological:     General: No focal deficit present.     Mental Status: He is alert and oriented to person, place, and time.  Psychiatric:        Mood and Affect: Mood normal.        Behavior: Behavior normal.      UC Treatments / Results  Labs (all labs ordered are listed, but only abnormal results are displayed) Labs Reviewed - No data to display  EKG   Radiology No results found.  Procedures Procedures (including critical care time)  Medications Ordered in UC Medications  dexamethasone (DECADRON) injection 10 mg (10 mg Intramuscular Given 01/02/23 1945)    Initial Impression / Assessment and Plan / UC Course  I have reviewed the triage vital signs and the nursing notes.  Pertinent labs &  imaging results that were available during my care of the patient were reviewed by me and considered in my medical decision making (see chart for details).  Rash appears to be consistent with poison ivy dermatitis.  Decadron  10 mg IM administered.  Prednisone 40 mg and triamcinolone cream 0.1% prescribed for symptoms.  Supportive care recommendations were provided and discussed with the patient to include use of over-the-counter antihistamines, cool compresses to the affected areas for itching, and use of Aveeno colloidal oatmeal bath to help with itching.  Patient was advised that symptoms may worsen, but to continue medications, instructed to follow-up as needed.  Patient is in agreement with this plan of care and verbalizes understanding.  All questions were answered.  Patient stable for discharge.  Final Clinical Impressions(s) / UC Diagnoses   Final diagnoses:  Rash and nonspecific skin eruption     Discharge Instructions      Take medication as prescribed. May also take over-the-counter Zyrtec during the daytime and Benadryl at bedtime to help with itching. Avoid hot baths or showers while symptoms persist.  Recommend taking lukewarm baths. May apply cool cloths to the area to help with itching or discomfort. Avoid scratching, rubbing, or manipulating the areas while symptoms persist. Recommend Aveeno colloidal oatmeal bath to use to help with drying and itching. If symptoms do not improve with this treatment, you may follow-up in this clinic or with your primary care physician for further evaluation. Follow-up as needed.     ED Prescriptions     Medication Sig Dispense Auth. Provider   predniSONE (DELTASONE) 20 MG tablet Take 2 tablets (40 mg total) by mouth daily with breakfast for 5 days. 10 tablet Kailan Carmen-Warren, Sadie Haber, NP   triamcinolone cream (KENALOG) 0.1 % Apply 1 Application topically 2 (two) times daily. 80 g Lanay Zinda-Warren, Sadie Haber, NP      PDMP not reviewed  this encounter.   Abran Cantor, NP 01/02/23 1954

## 2023-01-02 NOTE — ED Triage Notes (Signed)
Pt reports he came in contact with poison  ivy and now has a rash all over his arms.

## 2023-03-22 ENCOUNTER — Encounter (HOSPITAL_BASED_OUTPATIENT_CLINIC_OR_DEPARTMENT_OTHER): Payer: Self-pay

## 2023-03-22 ENCOUNTER — Emergency Department (HOSPITAL_BASED_OUTPATIENT_CLINIC_OR_DEPARTMENT_OTHER)
Admission: EM | Admit: 2023-03-22 | Discharge: 2023-03-22 | Disposition: A | Discharge status: Home / Self Care | Payer: Self-pay | Attending: Emergency Medicine | Admitting: Emergency Medicine

## 2023-03-22 ENCOUNTER — Emergency Department (HOSPITAL_BASED_OUTPATIENT_CLINIC_OR_DEPARTMENT_OTHER): Payer: Self-pay | Admitting: Radiology

## 2023-03-22 ENCOUNTER — Other Ambulatory Visit: Payer: Self-pay

## 2023-03-22 ENCOUNTER — Emergency Department (HOSPITAL_BASED_OUTPATIENT_CLINIC_OR_DEPARTMENT_OTHER): Payer: Self-pay

## 2023-03-22 DIAGNOSIS — X58XXXA Exposure to other specified factors, initial encounter: Secondary | ICD-10-CM | POA: Insufficient documentation

## 2023-03-22 DIAGNOSIS — J101 Influenza due to other identified influenza virus with other respiratory manifestations: Secondary | ICD-10-CM | POA: Insufficient documentation

## 2023-03-22 DIAGNOSIS — Z1152 Encounter for screening for COVID-19: Secondary | ICD-10-CM | POA: Insufficient documentation

## 2023-03-22 DIAGNOSIS — S8391XA Sprain of unspecified site of right knee, initial encounter: Secondary | ICD-10-CM | POA: Insufficient documentation

## 2023-03-22 LAB — RESP PANEL BY RT-PCR (RSV, FLU A&B, COVID)  RVPGX2
Influenza A by PCR: POSITIVE — AB
Influenza B by PCR: NEGATIVE
Resp Syncytial Virus by PCR: NEGATIVE
SARS Coronavirus 2 by RT PCR: NEGATIVE

## 2023-03-22 NOTE — ED Triage Notes (Addendum)
Patient presents to ED with c/o generalized body aches, subjective fever, non productive cough for 4 days. Denies chest pain, shortness of breath

## 2023-03-22 NOTE — Discharge Instructions (Addendum)
Continue Tylenol and ibuprofen as needed for fever control.  Drink plenty of fluids and rest.  If you need to be fever free for 24 to 48 hours prior to attending any family gatherings.  X-rays today were normal.

## 2023-03-22 NOTE — ED Provider Notes (Signed)
[place] EMERGENCY DEPARTMENT AT [facility] Provider Note   CSN: [id number] Arrival date & time: 03/22/23  1059     History  Chief Complaint  Patient presents with   Generalized Body Aches         Antonio Ellis is a 27 y.o. male.  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  Occasional shortness of breath and diarrhea but no vomiting.  No wheezing or significant son is sick with similar symptoms.  He does not have a history of asthma or use an inhaler.  Secondly he reports that last night he stepped on a rug that slipped causing him to twist his right knee which has been bothering him anyway but now it is hurting so bad he is unable to bear weight on it.  He denies injuring himself anywhere else.  He did report seeing Ortho last week and they have an MRI scheduled for the 12th.  The history is provided by the patient.       Home Medications Prior to Admission medications   Medication Sig Start Date End Date Taking? Authorizing Provider  amoxicillin-clavulanate (AUGMENTIN) 875-125 MG tablet Take 1 tablet by mouth every 12 (twelve) hours. 07/04/22   Gilda Crease, MD  benzonatate (TESSALON) 200 MG capsule Take 1 capsule (200 mg total) by mouth 3 (three) times daily as needed for cough. 05/15/22   Rodriguez-Southworth, Nettie Elm, PA-C  brompheniramine-pseudoephedrine-DM 30-2-10 MG/5ML syrup Take 5 mLs by mouth 4 (four) times daily as needed. 07/03/22   Leath-Warren, Sadie Haber, NP  fexofenadine-pseudoephedrine (ALLEGRA-D) 60-120 MG 12 hr tablet Take 1 tablet by mouth every 12 (twelve) hours. 05/15/22   Rodriguez-Southworth, Nettie Elm, PA-C  fluticasone (FLONASE) 50 MCG/ACT nasal spray Place 2 sprays into both nostrils daily. 07/03/22   Leath-Warren, Sadie Haber, NP  lidocaine (XYLOCAINE) 2 % solution Use as  directed 5 mLs in the mouth or throat every 6 (six) hours as needed for mouth pain. Gargle and spit 5 mL every 6 hours as needed for throat pain or discomfort. 07/03/22   Leath-Warren, Sadie Haber, NP  triamcinolone cream (KENALOG) 0.1 % Apply 1 Application topically 2 (two) times daily. 01/02/23   Leath-Warren, Sadie Haber, NP      Allergies    Patient has no known allergies.    Review of Systems   Review of Systems  Physical Exam Updated Vital Signs BP 128/72   Pulse 73   Temp 98.6 F (37 C)   Resp 18   SpO2 100%  Physical Exam Vitals and nursing note reviewed.  Constitutional:      General: He is not in acute distress.    Appearance: He is well-developed.  HENT:     Head: Normocephalic and atraumatic.     Right Ear: Tympanic membrane normal.     Left Ear: Tympanic membrane normal.     Nose: Congestion present.     Mouth/Throat:     Mouth: Mucous membranes are moist.     Pharynx: Posterior oropharyngeal erythema present. No oropharyngeal exudate.  Eyes:     Conjunctiva/sclera: Conjunctivae normal.     Pupils: Pupils are equal, round, and reactive to light.  Cardiovascular:     Rate and Rhythm: Normal rate and regular rhythm.     Heart sounds: No murmur heard. Pulmonary:     Effort: Pulmonary effort  is normal. No respiratory distress.     Breath sounds: Normal breath sounds. No wheezing or rales.  Abdominal:     General: There is no distension.     Palpations: Abdomen is soft.     Tenderness: There is no abdominal tenderness. There is no guarding or rebound.  Musculoskeletal:        General: Tenderness present. Normal range of motion.     Cervical back: Normal range of motion and neck supple.     Right knee: No swelling or crepitus. Tenderness present over the lateral joint line. No ACL, PCL or patellar tendon tenderness.  Skin:    General: Skin is warm and dry.     Findings: No erythema or rash.  Neurological:     Mental Status: He is alert and oriented to person,  place, and time.  Psychiatric:        Behavior: Behavior normal.     ED Results / Procedures / Treatments   Labs (all labs ordered are listed, but only abnormal results are displayed) Labs Reviewed  RESP PANEL BY RT-PCR (RSV, FLU A&B, COVID)  [id number] - Abnormal; Notable for the following components:      Result Value   Influenza A by PCR POSITIVE (*)    All other components within normal limits    EKG None  Radiology DG Knee Complete 4 Views Right Result Date: 03/22/2023 CLINICAL DATA:  Knee pain after a twisting injury. EXAM: RIGHT KNEE - COMPLETE 4+ VIEW COMPARISON:  None Available. FINDINGS: No evidence of fracture, dislocation, or joint effusion. No evidence of arthropathy or other focal bone abnormality. Soft tissues are unremarkable. IMPRESSION: Negative. Electronically Signed   By: Romona Curls M.D.   On: 03/22/2023 12:49   DG Chest Port 1 View Result Date: 03/22/2023 CLINICAL DATA:  Cough for 4 days. EXAM: PORTABLE CHEST 1 VIEW COMPARISON:  None Available. FINDINGS: The heart size and mediastinal contours are within normal limits. Both lungs are clear. The visualized skeletal structures are unremarkable. IMPRESSION: No active disease. Electronically Signed   By: Romona Curls M.D.   On: 03/22/2023 12:48    Procedures Procedures    Medications Ordered in ED Medications - No data to display  ED Course/ Medical Decision Making/ A&P                                 Medical Decision Making Amount and/or Complexity of Data Reviewed Radiology: ordered and independent interpretation performed. Decision-making details documented in ED Course.   Pt with symptoms consistent with influenza.  Normal exam here and afebrile.  No signs of breathing difficulty  No signs of strep pharyngitis, otitis or abnormal abdominal findings.  Flu A positive. I have independently visualized and interpreted pt's images today. CXR wnl.  KNee images are wnl.  Pt given crutches and knee brace.   Will continue antipyretica and rest and fluids and return for any further problems.         Final Clinical Impression(s) / ED Diagnoses Final diagnoses:  Influenza A  Sprain of right knee, unspecified ligament, initial encounter    Rx / DC Orders ED Discharge Orders     None         Gwyneth Sprout, MD 03/22/23 1346

## 2023-03-22 NOTE — ED Notes (Signed)
Pt refused knee sleeve/crutches

## 2023-06-16 IMAGING — DX DG FINGER LITTLE 2+V*R*
3 series · 3 of 3 positions shown · non-contrast
Comparison: Right hand radiographs 06/22/2020

CLINICAL DATA: Patient fell on hand with limited range of motion in
the pinky finger.

EXAM:
RIGHT LITTLE FINGER 2+V

[finger ap]
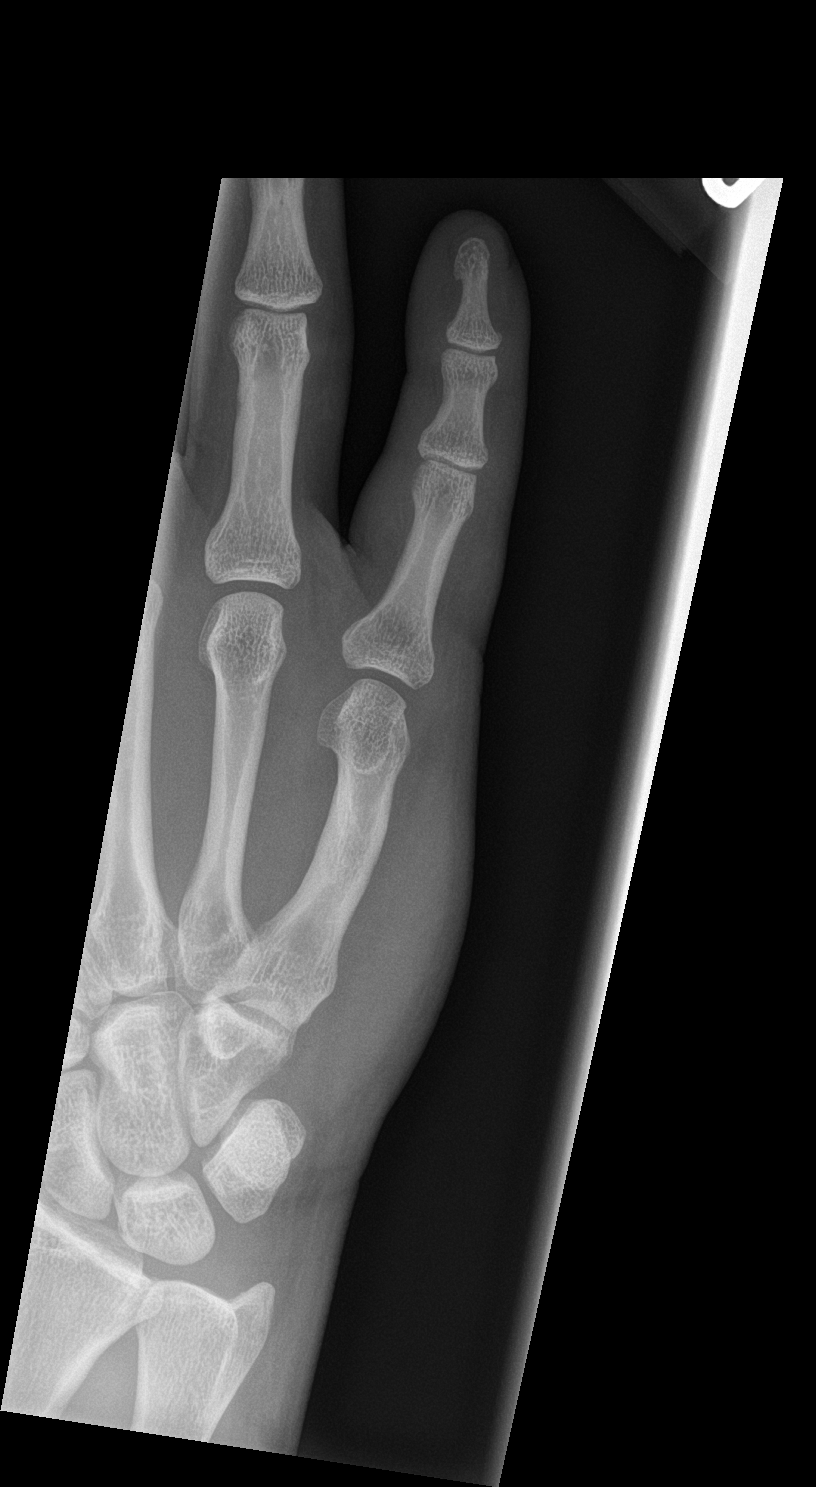

[finger obl]
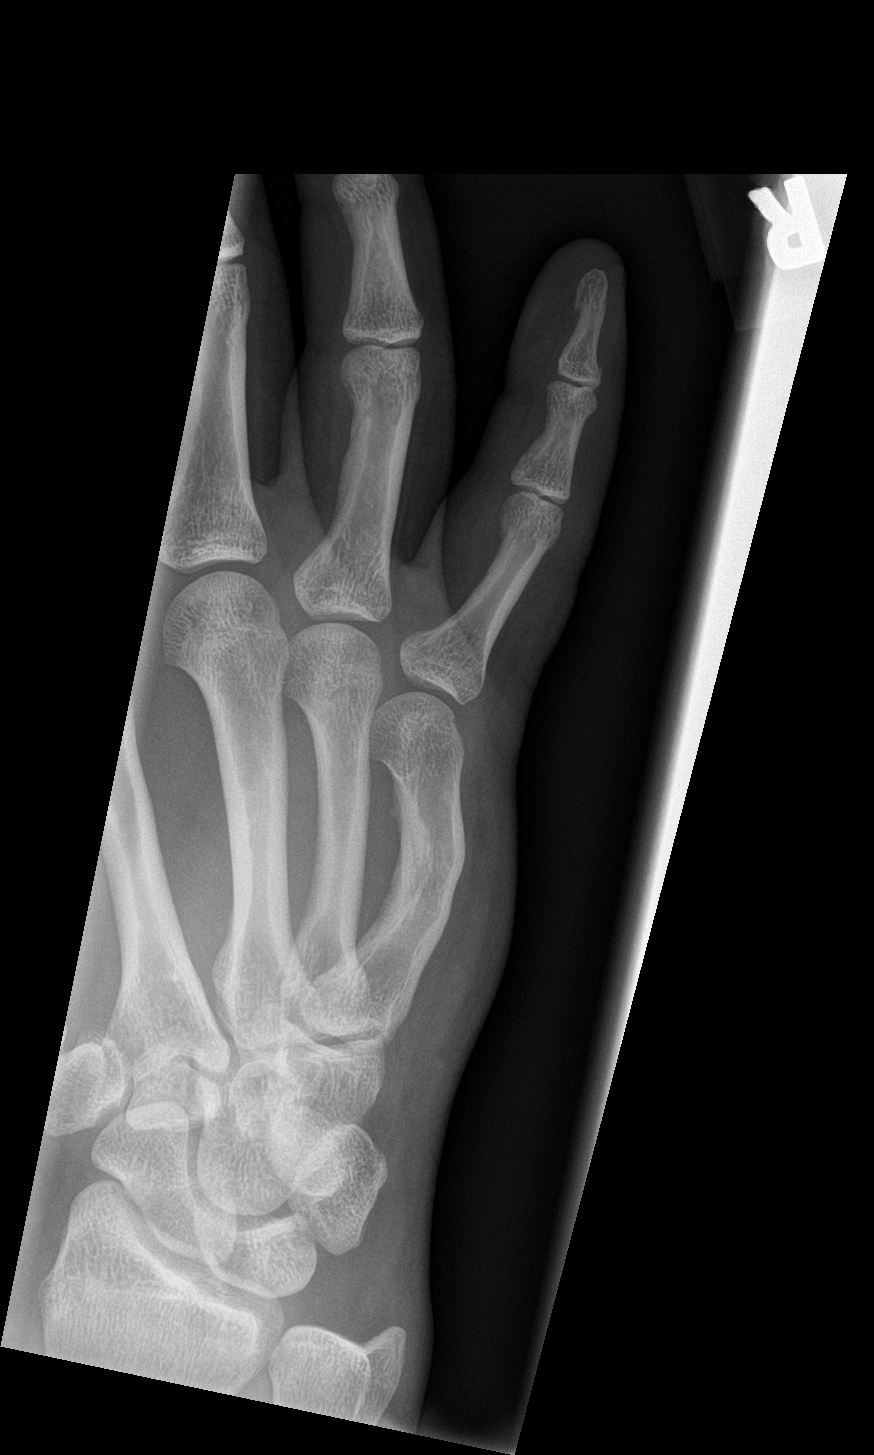

[finger lat]
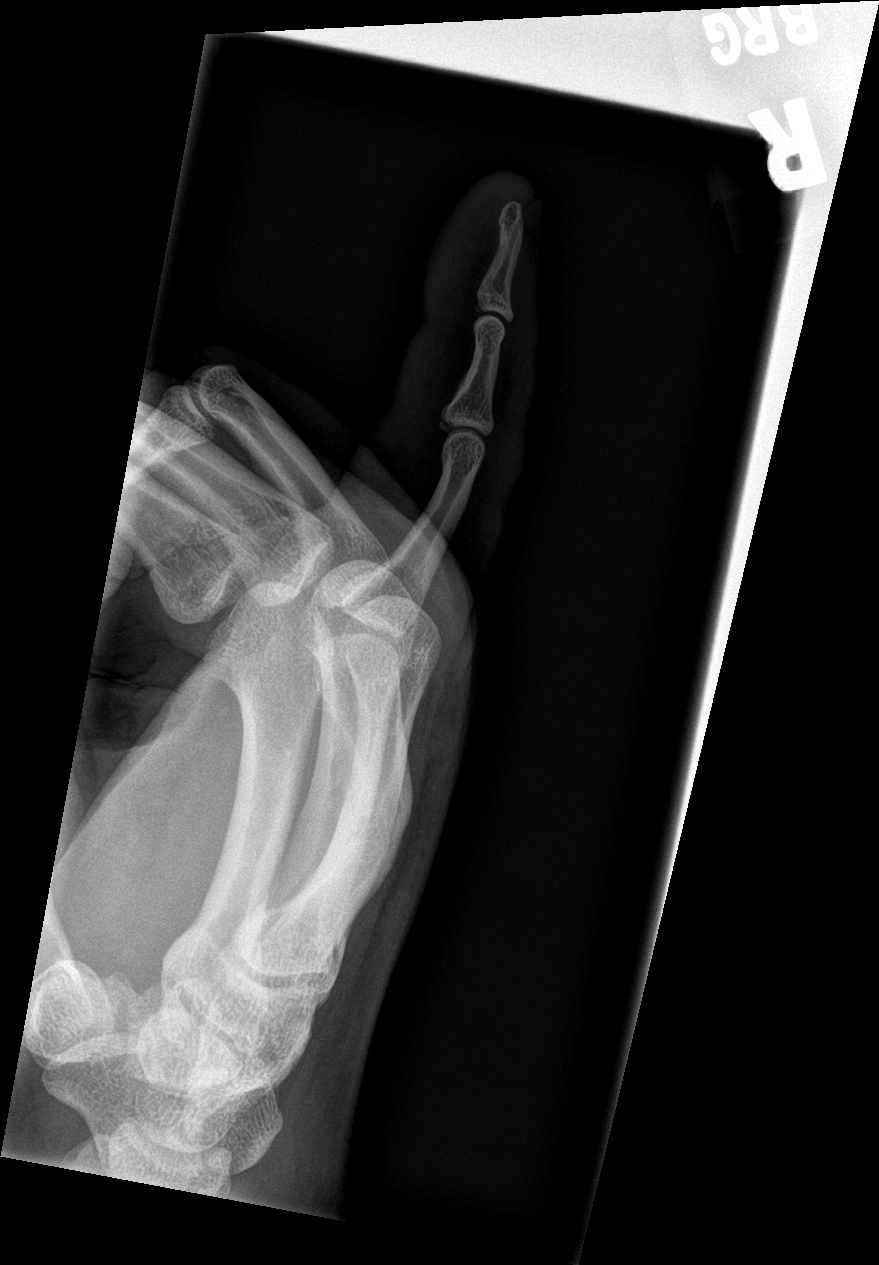

[3 of 3 positions shown; findings below may reference images not displayed]

FINDINGS: At the bases of both the distal and middle phalanxes there are tiny
osseous fragments seen only on lateral view which are nonspecific
and could represent acute fractures, remote prior injury or
degenerative change. No evidence of dislocation. Deformity of the
fifth metatarsal likely related to remote injury.
IMPRESSION: Tiny osseous fragments at the bases of both the distal and middle
phalanxes are seen only on lateral view. These are nonspecific and
could represent acute fractures, remote prior injury, or
degenerative change.

## 2023-09-10 ENCOUNTER — Ambulatory Visit
Admission: EM | Admit: 2023-09-10 | Discharge: 2023-09-10 | Disposition: A | Discharge status: Home / Self Care | Payer: Self-pay | Attending: Nurse Practitioner | Admitting: Nurse Practitioner

## 2023-09-10 DIAGNOSIS — J069 Acute upper respiratory infection, unspecified: Secondary | ICD-10-CM

## 2023-09-10 LAB — POCT RAPID STREP A (OFFICE): Rapid Strep A Screen: NEGATIVE

## 2023-09-10 LAB — POC SARS CORONAVIRUS 2 AG -  ED: SARS Coronavirus 2 Ag: NEGATIVE

## 2023-09-10 MED ORDER — FLUTICASONE PROPIONATE 50 MCG/ACT NA SUSP
2.0000 | Freq: Every day | NASAL | 0 refills | Status: AC
Start: 2023-09-10 — End: ?

## 2023-09-10 MED ORDER — PROMETHAZINE-DM 6.25-15 MG/5ML PO SYRP: 5.0000 mL | ORAL_SOLUTION | Freq: Four times a day (QID) | ORAL | 0 refills | Status: AC | PRN

## 2023-09-10 NOTE — ED Triage Notes (Signed)
 Pt reports tick bites, on right side several had been just crawling, pt states he has SOB, chest tightness, congestion, body ache headache and fever

## 2023-09-10 NOTE — Discharge Instructions (Addendum)
 The rapid strep test and COVID test were negative. Take medication as prescribed. Increase fluids and allow for plenty of rest. You may take over-the-counter Tylenol  or ibuprofen  as needed for pain, fever, or general discomfort. Recommend normal saline nasal spray throughout the day for nasal congestion and runny nose. It may be helpful to use a humidifier at nighttime during sleep and to sleep elevated on pillows while symptoms persist. Symptoms should improve over the next 5 to 7 days.  If symptoms fail to improve, or appear to be worsening, you may follow-up in this clinic or with your primary care physician for further evaluation. Follow-up as needed.

## 2023-09-10 NOTE — ED Provider Notes (Signed)
 [facility]    CSN: [id number] Arrival date & time: 09/10/23  1707      History   Chief Complaint No chief complaint on file.   HPI Antonio Ellis is a 28 y.o. male.   The history is provided by the patient.   Patient presents for complaints of fever, body aches, headache, shortness of breath, chest tightness, and chest congestion.  Tmax 101.5.  Denies ear pain, ear drainage, wheezing, chest pain, abdominal pain, nausea, vomiting, diarrhea, or rash.  Patient denies any obvious known sick contacts.  States he has not taken any medication for his symptoms.  Past Medical History:  Diagnosis Date   Allergy    RHINITIS   Obesity     Patient Active Problem List   Diagnosis Date Noted   Screening for diabetes mellitus 08/03/2019   Erectile dysfunction 08/03/2019   Marital conflict 08/03/2019   Counseled about COVID-19 virus infection 08/03/2019   Need for hepatitis A vaccination 04/19/2011   Need for HPV vaccination 04/19/2011   Obesity 10/11/2010    Past Surgical History:  Procedure Laterality Date   NO PAST SURGERIES  07/2019       Home Medications    Prior to Admission medications   Medication Sig Start Date End Date Taking? Authorizing Provider  amoxicillin -clavulanate (AUGMENTIN ) 875-125 MG tablet Take 1 tablet by mouth every 12 (twelve) hours. 07/04/22   Ballard Bongo, MD  benzonatate  (TESSALON ) 200 MG capsule Take 1 capsule (200 mg total) by mouth 3 (three) times daily as needed for cough. 05/15/22   Rodriguez-Southworth, Sylvia, PA-C  brompheniramine-pseudoephedrine-DM 30-2-10 MG/5ML syrup Take 5 mLs by mouth 4 (four) times daily as needed. 07/03/22   Leath-Warren, Belen Bowers, NP  fexofenadine -pseudoephedrine (ALLEGRA-D) 60-120 MG 12 hr tablet Take 1 tablet by mouth every 12 (twelve) hours. 05/15/22   Rodriguez-Southworth, Sylvia, PA-C  fluticasone  (FLONASE ) 50 MCG/ACT nasal spray Place 2 sprays into both nostrils daily. 09/10/23  Yes  Leath-Warren, Belen Bowers, NP  lidocaine  (XYLOCAINE ) 2 % solution Use as directed 5 mLs in the mouth or throat every 6 (six) hours as needed for mouth pain. Gargle and spit 5 mL every 6 hours as needed for throat pain or discomfort. 07/03/22   Leath-Warren, Belen Bowers, NP  promethazine-dextromethorphan (PROMETHAZINE-DM) 6.25-15 MG/5ML syrup Take 5 mLs by mouth 4 (four) times daily as needed. 09/10/23  Yes Leath-Warren, Belen Bowers, NP  triamcinolone  cream (KENALOG ) 0.1 % Apply 1 Application topically 2 (two) times daily. 01/02/23   Leath-Warren, Belen Bowers, NP    Family History Family History  Problem Relation Age of Onset   Hypertension Father    Heart disease Father 29       MI   Hypertension Paternal Uncle    Seizures Paternal Grandmother    Hypertension Paternal Grandmother    Heart disease Paternal Grandmother    Seizures Paternal Grandfather    Hypertension Maternal Grandmother    Hyperlipidemia Maternal Grandmother    Diabetes Maternal Grandmother    Cancer Maternal Grandfather        bladder   Hypertension Maternal Grandfather    Hyperlipidemia Maternal Grandfather    Stroke Neg Hx     Social History Social History   Tobacco Use   Smoking status: Every Day    Types: Cigarettes   Smokeless tobacco: Never  Vaping Use   Vaping status: Never Used  Substance Use Topics   Alcohol use: Yes    Alcohol/week: 2.0 standard drinks of alcohol  Types: 2 Cans of beer per week    Comment: occ   Drug use: Yes    Types: Marijuana    Comment: denies at this time     Allergies   Patient has no known allergies.   Review of Systems Review of Systems Per HPI  Physical Exam Triage Vital Signs ED Triage Vitals  Encounter Vitals Group     BP 09/10/23 1759 119/80     Systolic BP Percentile --      Diastolic BP Percentile --      Pulse Rate 09/10/23 1759 97     Resp 09/10/23 1759 20     Temp 09/10/23 1759 98.9 F (37.2 C)     Temp Source 09/10/23 1759 Oral     SpO2 09/10/23  1759 96 %     Weight --      Height --      Head Circumference --      Peak Flow --      Pain Score 09/10/23 1803 2     Pain Loc --      Pain Education --      Exclude from Growth Chart --    No data found.  Updated Vital Signs BP 119/80 (BP Location: Right Arm)   Pulse 97   Temp 98.9 F (37.2 C) (Oral)   Resp 20   SpO2 96%   Visual Acuity Right Eye Distance:   Left Eye Distance:   Bilateral Distance:    Right Eye Near:   Left Eye Near:    Bilateral Near:     Physical Exam Vitals and nursing note reviewed.  Constitutional:      General: He is not in acute distress.    Appearance: Normal appearance.  HENT:     Head: Normocephalic.     Right Ear: Tympanic membrane, ear canal and external ear normal.     Left Ear: Tympanic membrane, ear canal and external ear normal.     Nose: Congestion present.     Mouth/Throat:     Mouth: Mucous membranes are moist.  Eyes:     Extraocular Movements: Extraocular movements intact.     Conjunctiva/sclera: Conjunctivae normal.     Pupils: Pupils are equal, round, and reactive to light.  Cardiovascular:     Rate and Rhythm: Normal rate and regular rhythm.     Pulses: Normal pulses.     Heart sounds: Normal heart sounds.  Pulmonary:     Effort: Pulmonary effort is normal. No respiratory distress.     Breath sounds: Normal breath sounds. No stridor. No wheezing, rhonchi or rales.  Abdominal:     General: Bowel sounds are normal.     Palpations: Abdomen is soft.     Tenderness: There is no abdominal tenderness.  Musculoskeletal:     Cervical back: Normal range of motion.  Skin:    General: Skin is warm.  Neurological:     General: No focal deficit present.     Mental Status: He is alert and oriented to person, place, and time.  Psychiatric:        Mood and Affect: Mood normal.        Behavior: Behavior normal.      UC Treatments / Results  Labs (all labs ordered are listed, but only abnormal results are displayed) Labs  Reviewed  POC SARS CORONAVIRUS 2 AG -  ED  POCT RAPID STREP A (OFFICE)    EKG   Radiology No results found.  Procedures Procedures (including critical care time)  Medications Ordered in UC Medications - No data to display  Initial Impression / Assessment and Plan / UC Course  I have reviewed the triage vital signs and the nursing notes.  Pertinent labs & imaging results that were available during my care of the patient were reviewed by me and considered in my medical decision making (see chart for details).  The rapid strep test and COVID test were negative. The patient is well-appearing and is in no acute distress, lungs clear throughout, room air sats at 96%. Symptoms consistent with viral uri with cough.  Symptomatic treatment provided with Promethazine DM for the cough, and fluticasone  50 micro nasal spray for nasal congestion and runny nose.  Supportive care recommendations were provided and discussed with the patient to include fluids, rest, over-the-counter analgesics, normal saline nasal spray, use of a humidifier at nighttime during sleep.  Discussed indications with patient regarding follow-up.  Patient was in agreement with this plan of care and verbalizes understanding.  All questions were answered.  Patient stable for discharge.  Final Clinical Impressions(s) / UC Diagnoses   Final diagnoses:  Viral URI with cough     Discharge Instructions      The rapid strep test and COVID test were negative. Take medication as prescribed. Increase fluids and allow for plenty of rest. You may take over-the-counter Tylenol  or ibuprofen  as needed for pain, fever, or general discomfort. Recommend normal saline nasal spray throughout the day for nasal congestion and runny nose. It may be helpful to use a humidifier at nighttime during sleep and to sleep elevated on pillows while symptoms persist. Symptoms should improve over the next 5 to 7 days.  If symptoms fail to improve, or  appear to be worsening, you may follow-up in this clinic or with your primary care physician for further evaluation. Follow-up as needed.    ED Prescriptions     Medication Sig Dispense Auth. Provider   promethazine-dextromethorphan (PROMETHAZINE-DM) 6.25-15 MG/5ML syrup Take 5 mLs by mouth 4 (four) times daily as needed. 118 mL Leath-Warren, Belen Bowers, NP   fluticasone  (FLONASE ) 50 MCG/ACT nasal spray Place 2 sprays into both nostrils daily. 16 g Leath-Warren, Belen Bowers, NP      PDMP not reviewed this encounter.   Hardy Lia, NP 09/10/23 1948

## 2023-11-11 DIAGNOSIS — Z419 Encounter for procedure for purposes other than remedying health state, unspecified: Secondary | ICD-10-CM | POA: Diagnosis not present

## 2023-12-12 DIAGNOSIS — Z419 Encounter for procedure for purposes other than remedying health state, unspecified: Secondary | ICD-10-CM | POA: Diagnosis not present

## 2024-01-11 DIAGNOSIS — Z419 Encounter for procedure for purposes other than remedying health state, unspecified: Secondary | ICD-10-CM | POA: Diagnosis not present

## 2024-02-11 DIAGNOSIS — Z419 Encounter for procedure for purposes other than remedying health state, unspecified: Secondary | ICD-10-CM | POA: Diagnosis not present

## 2024-03-12 DIAGNOSIS — Z419 Encounter for procedure for purposes other than remedying health state, unspecified: Secondary | ICD-10-CM | POA: Diagnosis not present
